# Patient Record
Sex: Male | Born: 1965 | Race: White | Hispanic: No | Marital: Single | Smoking: Current every day smoker
Health system: Southern US, Community
[De-identification: ages and names within clinical notes are randomized; demographics above are authoritative.]

## PROBLEM LIST (undated history)

## (undated) DIAGNOSIS — F909 Attention-deficit hyperactivity disorder, unspecified type: Secondary | ICD-10-CM

## (undated) DIAGNOSIS — I10 Essential (primary) hypertension: Secondary | ICD-10-CM

## (undated) HISTORY — PX: LAMINECTOMY: SHX219

---

## 1999-10-27 ENCOUNTER — Emergency Department (HOSPITAL_COMMUNITY): Admission: EM | Admit: 1999-10-27 | Discharge: 1999-10-27 | Payer: Self-pay | Admitting: Emergency Medicine

## 1999-11-06 ENCOUNTER — Emergency Department (HOSPITAL_COMMUNITY): Admission: EM | Admit: 1999-11-06 | Discharge: 1999-11-06 | Payer: Self-pay | Admitting: Emergency Medicine

## 2001-12-25 ENCOUNTER — Inpatient Hospital Stay (HOSPITAL_COMMUNITY): Admission: EM | Admit: 2001-12-25 | Discharge: 2001-12-29 | Payer: Self-pay | Admitting: Psychiatry

## 2002-10-19 ENCOUNTER — Emergency Department (HOSPITAL_COMMUNITY): Admission: EM | Admit: 2002-10-19 | Discharge: 2002-10-19 | Payer: Self-pay | Admitting: Emergency Medicine

## 2002-10-19 ENCOUNTER — Encounter: Payer: Self-pay | Admitting: Emergency Medicine

## 2004-04-20 ENCOUNTER — Encounter: Admission: RE | Admit: 2004-04-20 | Discharge: 2004-04-20 | Payer: Self-pay | Admitting: Family Medicine

## 2004-05-16 ENCOUNTER — Encounter: Admission: RE | Admit: 2004-05-16 | Discharge: 2004-05-16 | Payer: Self-pay | Admitting: Orthopaedic Surgery

## 2004-06-02 ENCOUNTER — Encounter: Admission: RE | Admit: 2004-06-02 | Discharge: 2004-06-02 | Payer: Self-pay | Admitting: Surgery

## 2004-07-11 ENCOUNTER — Encounter: Admission: RE | Admit: 2004-07-11 | Discharge: 2004-07-11 | Payer: Self-pay | Admitting: Orthopaedic Surgery

## 2004-09-06 ENCOUNTER — Inpatient Hospital Stay (HOSPITAL_COMMUNITY): Admission: RE | Admit: 2004-09-06 | Discharge: 2004-09-10 | Payer: Self-pay | Admitting: Orthopaedic Surgery

## 2005-02-22 ENCOUNTER — Emergency Department (HOSPITAL_COMMUNITY): Admission: EM | Admit: 2005-02-22 | Discharge: 2005-02-22 | Payer: Self-pay | Admitting: Emergency Medicine

## 2005-02-25 ENCOUNTER — Inpatient Hospital Stay (HOSPITAL_COMMUNITY): Admission: RE | Admit: 2005-02-25 | Discharge: 2005-02-28 | Payer: Self-pay | Admitting: Psychiatry

## 2005-02-25 ENCOUNTER — Ambulatory Visit: Payer: Self-pay | Admitting: Psychiatry

## 2006-08-21 IMAGING — CR DG LUMBAR SPINE COMPLETE 4+V
5 series · 5 of 5 positions shown · non-contrast
Comparison: none

CLINICAL DATA: Low back pain and spasm, progressive over four days.
 DIAGNOSTIC LUMBAR SPINE, COMPLETE ? 04/20/04

[view not recorded (1 of 5)]
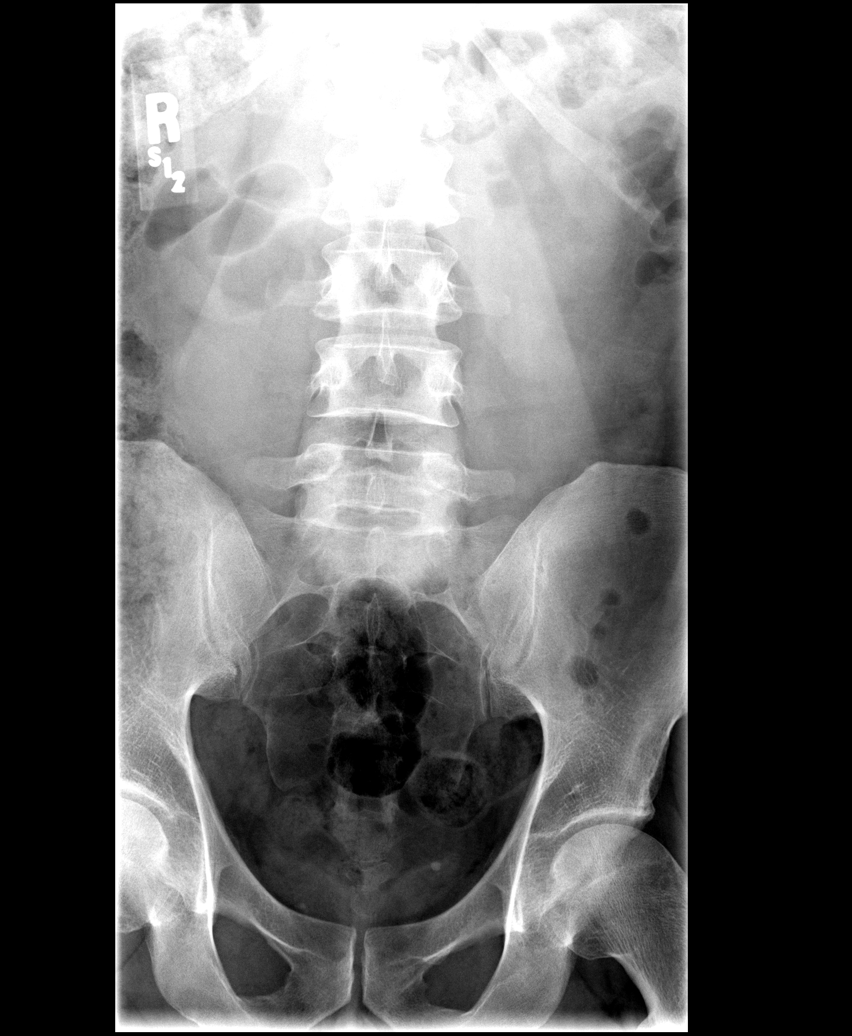

[view not recorded (2 of 5)]
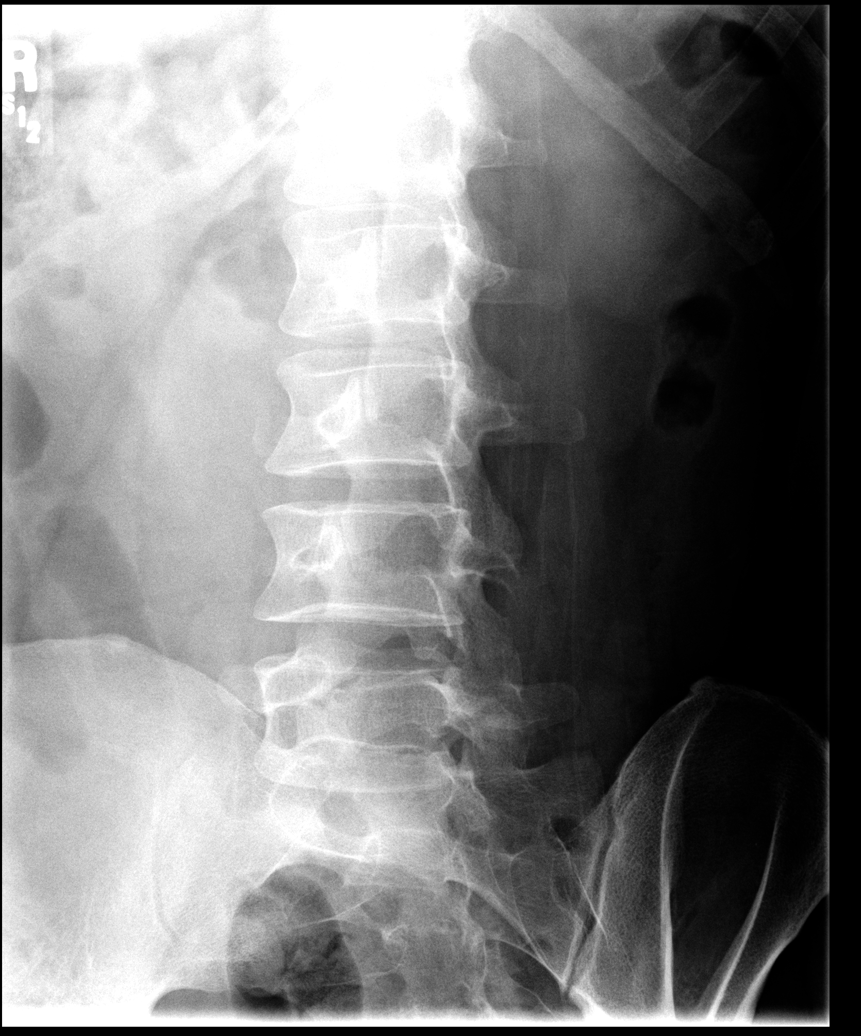

[view not recorded (3 of 5)]
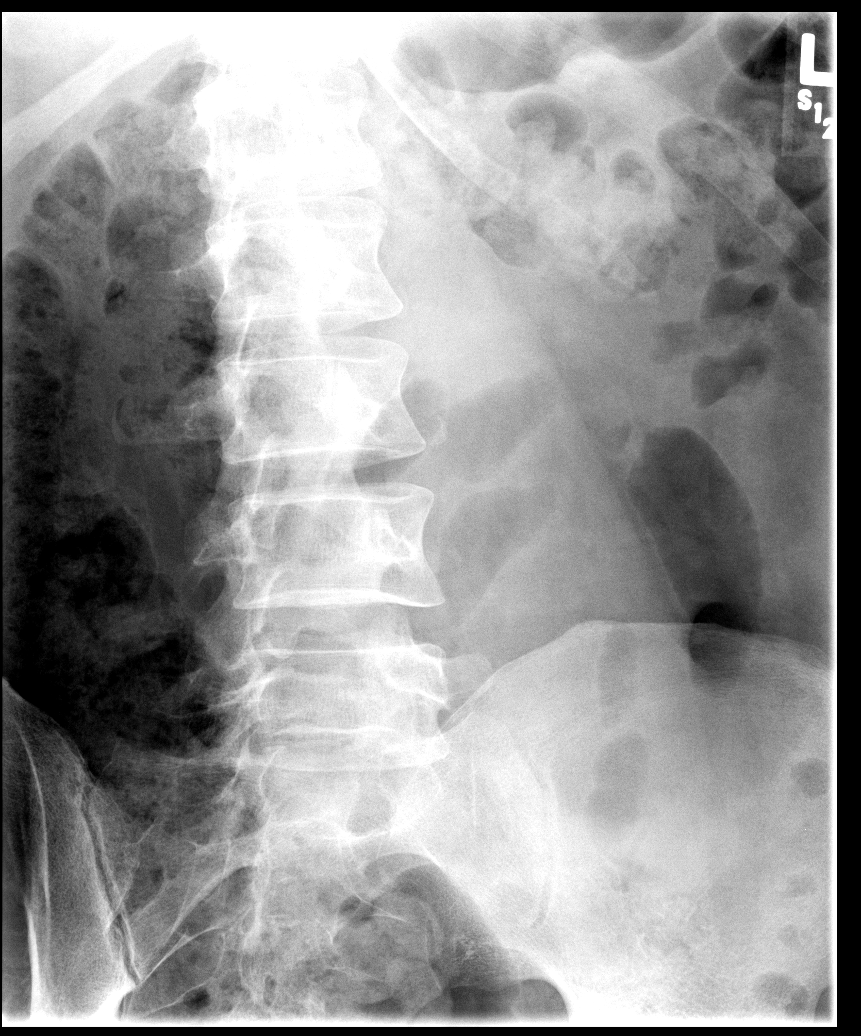

[view not recorded (4 of 5)]
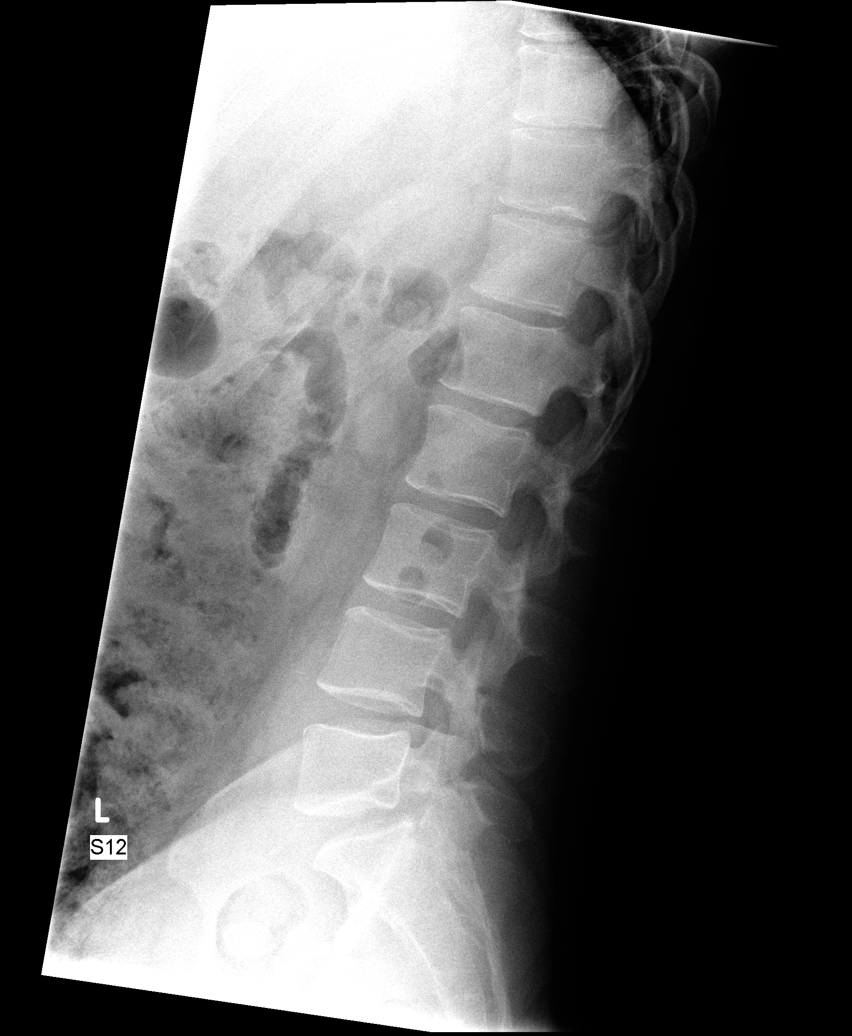

[view not recorded (5 of 5)]
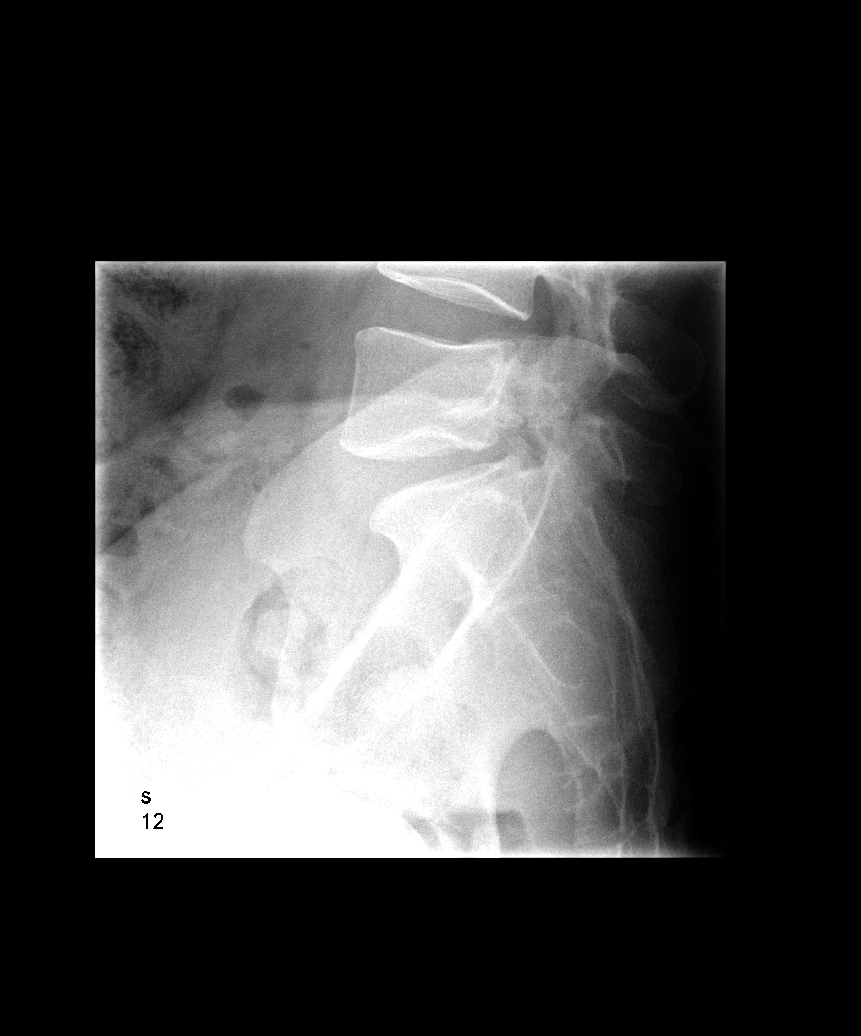

[5 of 5 positions shown; findings below may reference images not displayed]

FINDINGS: Five non rib bearing lumbar vertebrae are seen with right L5 and probable L5 spondylolysis.  Slight 6 mm anterolisthesis is seen at L5-S1 with slight degenerative disk space narrowing.  Remaining lumbar disk spaces and posterior vertebral alignment appear normally maintained.
 IMPRESSION
 1.  Probable bilateral L5 spondylolysis with 6 mm grade anterolisthesis, L5-S1.
 2.  Mild degenerative disk space narrowing, L5-S1.
 3.  Otherwise negative.

## 2015-01-13 ENCOUNTER — Emergency Department (HOSPITAL_COMMUNITY): Payer: Self-pay

## 2015-01-13 ENCOUNTER — Encounter (HOSPITAL_COMMUNITY): Admission: EM | Disposition: A | Discharge status: Home / Self Care | Payer: Self-pay | Source: Home / Self Care

## 2015-01-13 ENCOUNTER — Encounter (HOSPITAL_COMMUNITY): Payer: Self-pay | Admitting: Emergency Medicine

## 2015-01-13 ENCOUNTER — Inpatient Hospital Stay (HOSPITAL_COMMUNITY)
Admission: EM | Admit: 2015-01-13 | Discharge: 2015-01-16 | DRG: 339 | Disposition: A | Discharge status: Home / Self Care | Payer: Self-pay | Attending: Surgery | Admitting: Surgery

## 2015-01-13 ENCOUNTER — Inpatient Hospital Stay (HOSPITAL_COMMUNITY): Payer: Self-pay | Admitting: Registered Nurse

## 2015-01-13 ENCOUNTER — Other Ambulatory Visit (HOSPITAL_COMMUNITY): Payer: Self-pay

## 2015-01-13 DIAGNOSIS — Z79899 Other long term (current) drug therapy: Secondary | ICD-10-CM

## 2015-01-13 DIAGNOSIS — K567 Ileus, unspecified: Secondary | ICD-10-CM | POA: Diagnosis not present

## 2015-01-13 DIAGNOSIS — Z7982 Long term (current) use of aspirin: Secondary | ICD-10-CM

## 2015-01-13 DIAGNOSIS — I10 Essential (primary) hypertension: Secondary | ICD-10-CM | POA: Diagnosis present

## 2015-01-13 DIAGNOSIS — K353 Acute appendicitis with localized peritonitis: Principal | ICD-10-CM | POA: Diagnosis present

## 2015-01-13 DIAGNOSIS — F17210 Nicotine dependence, cigarettes, uncomplicated: Secondary | ICD-10-CM | POA: Diagnosis present

## 2015-01-13 DIAGNOSIS — F329 Major depressive disorder, single episode, unspecified: Secondary | ICD-10-CM | POA: Diagnosis present

## 2015-01-13 DIAGNOSIS — F909 Attention-deficit hyperactivity disorder, unspecified type: Secondary | ICD-10-CM | POA: Diagnosis present

## 2015-01-13 DIAGNOSIS — R52 Pain, unspecified: Secondary | ICD-10-CM

## 2015-01-13 DIAGNOSIS — K3533 Acute appendicitis with perforation and localized peritonitis, with abscess: Secondary | ICD-10-CM | POA: Diagnosis present

## 2015-01-13 DIAGNOSIS — Z833 Family history of diabetes mellitus: Secondary | ICD-10-CM

## 2015-01-13 HISTORY — DX: Attention-deficit hyperactivity disorder, unspecified type: F90.9

## 2015-01-13 HISTORY — PX: LAPAROSCOPIC APPENDECTOMY: SHX408

## 2015-01-13 HISTORY — DX: Essential (primary) hypertension: I10

## 2015-01-13 LAB — URINALYSIS, ROUTINE W REFLEX MICROSCOPIC
Bilirubin Urine: NEGATIVE
Glucose, UA: NEGATIVE mg/dL
Hgb urine dipstick: NEGATIVE
Ketones, ur: NEGATIVE mg/dL
Leukocytes, UA: NEGATIVE
Nitrite: NEGATIVE
Protein, ur: NEGATIVE mg/dL
Specific Gravity, Urine: 1.006 (ref 1.005–1.030)
Urobilinogen, UA: 0.2 mg/dL (ref 0.0–1.0)
pH: 7 (ref 5.0–8.0)

## 2015-01-13 LAB — CBC WITH DIFFERENTIAL/PLATELET
Basophils Absolute: 0 10*3/uL (ref 0.0–0.1)
Basophils Relative: 0 % (ref 0–1)
Eosinophils Absolute: 0.1 10*3/uL (ref 0.0–0.7)
Eosinophils Relative: 1 % (ref 0–5)
HCT: 43.9 % (ref 39.0–52.0)
Hemoglobin: 15.1 g/dL (ref 13.0–17.0)
Lymphocytes Relative: 12 % (ref 12–46)
Lymphs Abs: 2.1 10*3/uL (ref 0.7–4.0)
MCH: 35.4 pg — ABNORMAL HIGH (ref 26.0–34.0)
MCHC: 34.4 g/dL (ref 30.0–36.0)
MCV: 102.8 fL — ABNORMAL HIGH (ref 78.0–100.0)
Monocytes Absolute: 1.2 10*3/uL — ABNORMAL HIGH (ref 0.1–1.0)
Monocytes Relative: 7 % (ref 3–12)
Neutro Abs: 13.7 10*3/uL — ABNORMAL HIGH (ref 1.7–7.7)
Neutrophils Relative %: 80 % — ABNORMAL HIGH (ref 43–77)
Platelets: 194 10*3/uL (ref 150–400)
RBC: 4.27 MIL/uL (ref 4.22–5.81)
RDW: 12.7 % (ref 11.5–15.5)
WBC: 17.1 10*3/uL — ABNORMAL HIGH (ref 4.0–10.5)

## 2015-01-13 LAB — COMPREHENSIVE METABOLIC PANEL
ALT: 11 U/L — ABNORMAL LOW (ref 17–63)
AST: 20 U/L (ref 15–41)
Albumin: 4.6 g/dL (ref 3.5–5.0)
Alkaline Phosphatase: 108 U/L (ref 38–126)
Anion gap: 11 (ref 5–15)
BUN: 15 mg/dL (ref 6–20)
CO2: 25 mmol/L (ref 22–32)
Calcium: 9.6 mg/dL (ref 8.9–10.3)
Chloride: 100 mmol/L — ABNORMAL LOW (ref 101–111)
Creatinine, Ser: 1.08 mg/dL (ref 0.61–1.24)
GFR calc Af Amer: >60 mL/min (ref >60)
GFR calc non Af Amer: >60 mL/min (ref >60)
Glucose, Bld: 91 mg/dL (ref 65–99)
Potassium: 4.2 mmol/L (ref 3.5–5.1)
Sodium: 136 mmol/L (ref 135–145)
Total Bilirubin: 0.7 mg/dL (ref 0.3–1.2)
Total Protein: 8.6 g/dL — ABNORMAL HIGH (ref 6.5–8.1)

## 2015-01-13 LAB — ETHANOL: Alcohol, Ethyl (B): <5 mg/dL (ref <5)

## 2015-01-13 LAB — LIPASE, BLOOD: Lipase: 17 U/L — ABNORMAL LOW (ref 22–51)

## 2015-01-13 SURGERY — APPENDECTOMY, LAPAROSCOPIC
Anesthesia: General | Site: Abdomen

## 2015-01-13 MED ORDER — PIPERACILLIN-TAZOBACTAM 3.375 G IVPB 30 MIN: 3.3750 g | Freq: Once | INTRAVENOUS | Status: DC

## 2015-01-13 MED ORDER — PIPERACILLIN-TAZOBACTAM 3.375 G IVPB
3.3750 g | Freq: Three times a day (TID) | INTRAVENOUS | Status: DC
  Administered 2015-01-13 – 2015-01-16 (×9): 3.375 g via INTRAVENOUS
  Filled 2015-01-13 (×10): qty 50

## 2015-01-13 MED ORDER — LACTATED RINGERS IV SOLN
INTRAVENOUS | Status: DC
  Administered 2015-01-13: 1000 mL via INTRAVENOUS

## 2015-01-13 MED ORDER — KCL IN DEXTROSE-NACL 30-5-0.45 MEQ/L-%-% IV SOLN
INTRAVENOUS | Status: DC
  Administered 2015-01-13 – 2015-01-16 (×4): via INTRAVENOUS
  Filled 2015-01-13 (×5): qty 1000

## 2015-01-13 MED ORDER — LIDOCAINE HCL (CARDIAC) 20 MG/ML IV SOLN
INTRAVENOUS | Status: AC
  Filled 2015-01-13: qty 5

## 2015-01-13 MED ORDER — NEOSTIGMINE METHYLSULFATE 10 MG/10ML IV SOLN
INTRAVENOUS | Status: DC | PRN
  Administered 2015-01-13: 4 mg via INTRAVENOUS

## 2015-01-13 MED ORDER — PROPOFOL 10 MG/ML IV BOLUS
INTRAVENOUS | Status: DC | PRN
  Administered 2015-01-13: 160 mg via INTRAVENOUS

## 2015-01-13 MED ORDER — GLYCOPYRROLATE 0.2 MG/ML IJ SOLN
INTRAMUSCULAR | Status: DC | PRN
  Administered 2015-01-13: .4 mg via INTRAVENOUS

## 2015-01-13 MED ORDER — ONDANSETRON HCL 4 MG/2ML IJ SOLN: 4.0000 mg | Freq: Four times a day (QID) | INTRAMUSCULAR | Status: DC | PRN

## 2015-01-13 MED ORDER — BUPIVACAINE-EPINEPHRINE (PF) 0.25% -1:200000 IJ SOLN
INTRAMUSCULAR | Status: AC
  Filled 2015-01-13: qty 30

## 2015-01-13 MED ORDER — IBUPROFEN 200 MG PO TABS: 600.0000 mg | ORAL_TABLET | Freq: Four times a day (QID) | ORAL | Status: DC | PRN

## 2015-01-13 MED ORDER — IOHEXOL 300 MG/ML  SOLN
50.0000 mL | Freq: Once | INTRAMUSCULAR | Status: AC | PRN
  Administered 2015-01-13: 50 mL via ORAL

## 2015-01-13 MED ORDER — SUCCINYLCHOLINE CHLORIDE 20 MG/ML IJ SOLN
INTRAMUSCULAR | Status: DC | PRN
  Administered 2015-01-13: 100 mg via INTRAVENOUS

## 2015-01-13 MED ORDER — PNEUMOCOCCAL VAC POLYVALENT 25 MCG/0.5ML IJ INJ: 0.5000 mL | INJECTION | INTRAMUSCULAR | Status: DC

## 2015-01-13 MED ORDER — ONDANSETRON HCL 4 MG/2ML IJ SOLN
INTRAMUSCULAR | Status: DC | PRN
  Administered 2015-01-13: 4 mg via INTRAVENOUS

## 2015-01-13 MED ORDER — IOHEXOL 300 MG/ML  SOLN
100.0000 mL | Freq: Once | INTRAMUSCULAR | Status: AC | PRN
  Administered 2015-01-13: 100 mL via INTRAVENOUS

## 2015-01-13 MED ORDER — SODIUM CHLORIDE 0.9 % IJ SOLN
INTRAMUSCULAR | Status: AC
  Filled 2015-01-13: qty 10

## 2015-01-13 MED ORDER — KETOROLAC TROMETHAMINE 30 MG/ML IJ SOLN
INTRAMUSCULAR | Status: AC
  Filled 2015-01-13: qty 1

## 2015-01-13 MED ORDER — ONDANSETRON HCL 4 MG/2ML IJ SOLN
4.0000 mg | Freq: Once | INTRAMUSCULAR | Status: AC
  Administered 2015-01-13: 4 mg via INTRAVENOUS
  Filled 2015-01-13: qty 2

## 2015-01-13 MED ORDER — FENTANYL CITRATE (PF) 100 MCG/2ML IJ SOLN
INTRAMUSCULAR | Status: AC
  Filled 2015-01-13: qty 2

## 2015-01-13 MED ORDER — PROPOFOL 10 MG/ML IV BOLUS
INTRAVENOUS | Status: AC
  Filled 2015-01-13: qty 20

## 2015-01-13 MED ORDER — SODIUM CHLORIDE 0.9 % IV SOLN: INTRAVENOUS | Status: DC

## 2015-01-13 MED ORDER — PIPERACILLIN-TAZOBACTAM 3.375 G IVPB: 3.3750 g | Freq: Three times a day (TID) | INTRAVENOUS | Status: DC

## 2015-01-13 MED ORDER — MIDAZOLAM HCL 2 MG/2ML IJ SOLN
INTRAMUSCULAR | Status: AC
  Filled 2015-01-13: qty 2

## 2015-01-13 MED ORDER — PHENYLEPHRINE 40 MCG/ML (10ML) SYRINGE FOR IV PUSH (FOR BLOOD PRESSURE SUPPORT)
PREFILLED_SYRINGE | INTRAVENOUS | Status: AC
  Filled 2015-01-13: qty 10

## 2015-01-13 MED ORDER — SODIUM CHLORIDE 0.9 % IV BOLUS (SEPSIS): 1000.0000 mL | Freq: Once | INTRAVENOUS | Status: DC

## 2015-01-13 MED ORDER — EPHEDRINE SULFATE 50 MG/ML IJ SOLN
INTRAMUSCULAR | Status: AC
  Filled 2015-01-13: qty 1

## 2015-01-13 MED ORDER — FENTANYL CITRATE (PF) 100 MCG/2ML IJ SOLN
25.0000 ug | INTRAMUSCULAR | Status: DC | PRN
  Administered 2015-01-13 (×3): 50 ug via INTRAVENOUS

## 2015-01-13 MED ORDER — PROMETHAZINE HCL 25 MG/ML IJ SOLN: 6.2500 mg | INTRAMUSCULAR | Status: DC | PRN

## 2015-01-13 MED ORDER — HYDROCODONE-ACETAMINOPHEN 5-325 MG PO TABS
1.0000 | ORAL_TABLET | ORAL | Status: DC | PRN
  Administered 2015-01-14 (×4): 2 via ORAL
  Filled 2015-01-13 (×5): qty 2

## 2015-01-13 MED ORDER — DULOXETINE HCL 30 MG PO CPEP
30.0000 mg | ORAL_CAPSULE | Freq: Every day | ORAL | Status: DC
  Administered 2015-01-14 – 2015-01-16 (×3): 30 mg via ORAL
  Filled 2015-01-13 (×3): qty 1

## 2015-01-13 MED ORDER — KETOROLAC TROMETHAMINE 30 MG/ML IJ SOLN
30.0000 mg | Freq: Once | INTRAMUSCULAR | Status: AC
  Administered 2015-01-13: 30 mg via INTRAVENOUS

## 2015-01-13 MED ORDER — GLYCOPYRROLATE 0.2 MG/ML IJ SOLN
INTRAMUSCULAR | Status: AC
  Filled 2015-01-13: qty 3

## 2015-01-13 MED ORDER — PIPERACILLIN-TAZOBACTAM 3.375 G IVPB 30 MIN
3.3750 g | Freq: Once | INTRAVENOUS | Status: AC
  Administered 2015-01-13: 3.375 g via INTRAVENOUS
  Filled 2015-01-13: qty 50

## 2015-01-13 MED ORDER — SODIUM CHLORIDE 0.9 % IV BOLUS (SEPSIS)
1000.0000 mL | Freq: Once | INTRAVENOUS | Status: AC
  Administered 2015-01-13: 1000 mL via INTRAVENOUS

## 2015-01-13 MED ORDER — FENTANYL CITRATE (PF) 250 MCG/5ML IJ SOLN
INTRAMUSCULAR | Status: AC
  Filled 2015-01-13: qty 5

## 2015-01-13 MED ORDER — MORPHINE SULFATE 2 MG/ML IJ SOLN: 1.0000 mg | INTRAMUSCULAR | Status: DC | PRN

## 2015-01-13 MED ORDER — ONDANSETRON HCL 4 MG/2ML IJ SOLN
INTRAMUSCULAR | Status: AC
  Filled 2015-01-13: qty 2

## 2015-01-13 MED ORDER — HYDROMORPHONE HCL 1 MG/ML IJ SOLN
1.0000 mg | INTRAMUSCULAR | Status: DC | PRN
  Administered 2015-01-13 (×2): 1 mg via INTRAVENOUS
  Administered 2015-01-15 (×2): 2 mg via INTRAVENOUS
  Filled 2015-01-13: qty 1
  Filled 2015-01-13 (×2): qty 2
  Filled 2015-01-13 (×2): qty 1

## 2015-01-13 MED ORDER — ROCURONIUM BROMIDE 100 MG/10ML IV SOLN
INTRAVENOUS | Status: AC
  Filled 2015-01-13: qty 1

## 2015-01-13 MED ORDER — ROCURONIUM BROMIDE 100 MG/10ML IV SOLN
INTRAVENOUS | Status: DC | PRN
  Administered 2015-01-13: 30 mg via INTRAVENOUS

## 2015-01-13 MED ORDER — FENTANYL CITRATE (PF) 100 MCG/2ML IJ SOLN
INTRAMUSCULAR | Status: DC | PRN
  Administered 2015-01-13 (×3): 50 ug via INTRAVENOUS

## 2015-01-13 MED ORDER — AMPHETAMINE-DEXTROAMPHETAMINE 10 MG PO TABS
20.0000 mg | ORAL_TABLET | ORAL | Status: DC
  Administered 2015-01-14 – 2015-01-16 (×6): 20 mg via ORAL
  Filled 2015-01-13 (×6): qty 2

## 2015-01-13 MED ORDER — MIDAZOLAM HCL 5 MG/5ML IJ SOLN
INTRAMUSCULAR | Status: DC | PRN
  Administered 2015-01-13 (×2): 1 mg via INTRAVENOUS

## 2015-01-13 MED ORDER — ONDANSETRON HCL 4 MG/2ML IJ SOLN
4.0000 mg | Freq: Four times a day (QID) | INTRAMUSCULAR | Status: DC | PRN
  Administered 2015-01-16: 4 mg via INTRAVENOUS
  Filled 2015-01-13: qty 2

## 2015-01-13 MED ORDER — MORPHINE SULFATE 4 MG/ML IJ SOLN
4.0000 mg | Freq: Once | INTRAMUSCULAR | Status: AC
  Administered 2015-01-13: 4 mg via INTRAVENOUS
  Filled 2015-01-13: qty 1

## 2015-01-13 MED ORDER — LACTATED RINGERS IV SOLN
INTRAVENOUS | Status: DC | PRN
  Administered 2015-01-13: 1000 mL

## 2015-01-13 MED ORDER — PHENYLEPHRINE HCL 10 MG/ML IJ SOLN
INTRAMUSCULAR | Status: DC | PRN
  Administered 2015-01-13 (×2): 80 ug via INTRAVENOUS

## 2015-01-13 MED ORDER — BUPIVACAINE-EPINEPHRINE 0.25% -1:200000 IJ SOLN
INTRAMUSCULAR | Status: DC | PRN
  Administered 2015-01-13: 20 mL

## 2015-01-13 MED ORDER — LACTATED RINGERS IV SOLN
INTRAVENOUS | Status: DC | PRN
  Administered 2015-01-13: 16:00:00 via INTRAVENOUS

## 2015-01-13 MED ORDER — ATENOLOL 50 MG PO TABS
50.0000 mg | ORAL_TABLET | Freq: Every day | ORAL | Status: DC
Start: 2015-01-14 — End: 2015-01-16
  Administered 2015-01-14 – 2015-01-16 (×3): 50 mg via ORAL
  Filled 2015-01-13 (×3): qty 1

## 2015-01-13 MED ORDER — ONDANSETRON HCL 4 MG PO TABS: 4.0000 mg | ORAL_TABLET | Freq: Four times a day (QID) | ORAL | Status: DC | PRN

## 2015-01-13 MED ORDER — LIDOCAINE HCL (CARDIAC) 20 MG/ML IV SOLN
INTRAVENOUS | Status: DC | PRN
  Administered 2015-01-13: 80 mg via INTRAVENOUS

## 2015-01-13 MED ORDER — MEPERIDINE HCL 50 MG/ML IJ SOLN: 6.2500 mg | INTRAMUSCULAR | Status: DC | PRN

## 2015-01-13 SURGICAL SUPPLY — 43 items
APL SKNCLS STERI-STRIP NONHPOA (GAUZE/BANDAGES/DRESSINGS) ×1
APPLIER CLIP ROT 10 11.4 M/L (STAPLE) ×3
APR CLP MED LRG 11.4X10 (STAPLE) ×1
BAG SPEC RTRVL LRG 6X4 10 (ENDOMECHANICALS) ×1
BENZOIN TINCTURE PRP APPL 2/3 (GAUZE/BANDAGES/DRESSINGS) ×3 IMPLANT
CLIP APPLIE ROT 10 11.4 M/L (STAPLE) IMPLANT
CLOSURE WOUND 1/2 X4 (GAUZE/BANDAGES/DRESSINGS) ×1
CUTTER FLEX LINEAR 45M (STAPLE) ×2 IMPLANT
DECANTER SPIKE VIAL GLASS SM (MISCELLANEOUS) ×1 IMPLANT
DRAPE LAPAROSCOPIC ABDOMINAL (DRAPES) ×3 IMPLANT
ELECT REM PT RETURN 9FT ADLT (ELECTROSURGICAL) ×3
ELECTRODE REM PT RTRN 9FT ADLT (ELECTROSURGICAL) ×1 IMPLANT
ENDOLOOP SUT PDS II  0 18 (SUTURE)
ENDOLOOP SUT PDS II 0 18 (SUTURE) IMPLANT
GAUZE SPONGE 2X2 8PLY STRL LF (GAUZE/BANDAGES/DRESSINGS) IMPLANT
GLOVE BIOGEL PI IND STRL 7.0 (GLOVE) ×1 IMPLANT
GLOVE BIOGEL PI INDICATOR 7.0 (GLOVE)
GLOVE SURG ORTHO 8.0 STRL STRW (GLOVE) ×3 IMPLANT
GOWN STRL REUS W/TWL LRG LVL3 (GOWN DISPOSABLE) ×1 IMPLANT
GOWN STRL REUS W/TWL XL LVL3 (GOWN DISPOSABLE) ×4 IMPLANT
KIT BASIN OR (CUSTOM PROCEDURE TRAY) ×3 IMPLANT
PENCIL BUTTON HOLSTER BLD 10FT (ELECTRODE) IMPLANT
POUCH SPECIMEN RETRIEVAL 10MM (ENDOMECHANICALS) ×2 IMPLANT
RELOAD 45 VASCULAR/THIN (ENDOMECHANICALS) IMPLANT
RELOAD STAPLE 45 2.5 WHT GRN (ENDOMECHANICALS) IMPLANT
RELOAD STAPLE 45 3.5 BLU ETS (ENDOMECHANICALS) IMPLANT
RELOAD STAPLE TA45 3.5 REG BLU (ENDOMECHANICALS) ×3 IMPLANT
SET IRRIG TUBING LAPAROSCOPIC (IRRIGATION / IRRIGATOR) ×2 IMPLANT
SHEARS HARMONIC ACE PLUS 36CM (ENDOMECHANICALS) ×2 IMPLANT
SOLUTION ANTI FOG 6CC (MISCELLANEOUS) ×1 IMPLANT
SPONGE GAUZE 2X2 STER 10/PKG (GAUZE/BANDAGES/DRESSINGS) ×2
STRIP CLOSURE SKIN 1/2X4 (GAUZE/BANDAGES/DRESSINGS) ×2 IMPLANT
SUT MNCRL AB 4-0 PS2 18 (SUTURE) ×3 IMPLANT
SUT VICRYL 0 UR6 27IN ABS (SUTURE) ×4 IMPLANT
TOWEL OR 17X26 10 PK STRL BLUE (TOWEL DISPOSABLE) ×3 IMPLANT
TRAY CATH 16FR W/PLASTIC CATH (SET/KITS/TRAYS/PACK) ×2 IMPLANT
TRAY FOLEY W/METER SILVER 14FR (SET/KITS/TRAYS/PACK) ×1 IMPLANT
TRAY LAPAROSCOPIC (CUSTOM PROCEDURE TRAY) ×3 IMPLANT
TROCAR BLADELESS OPT 5 75 (ENDOMECHANICALS) ×3 IMPLANT
TROCAR XCEL BLUNT TIP 100MML (ENDOMECHANICALS) ×3 IMPLANT
TROCAR XCEL NON-BLD 11X100MML (ENDOMECHANICALS) ×3 IMPLANT
TUBING INSUFFLATION 10FT LAP (TUBING) ×3 IMPLANT
WATER STERILE IRR 1500ML POUR (IV SOLUTION) ×1 IMPLANT

## 2015-01-13 NOTE — Transfer of Care (Signed)
Immediate Anesthesia Transfer of Care Note  Patient: Sean Flores  Procedure(s) Performed: Procedure(s): APPENDECTOMY LAPAROSCOPIC (N/A)  Patient Location: PACU  Anesthesia Type:General  Level of Consciousness: awake, alert , oriented and patient cooperative  Airway & Oxygen Therapy: Patient Spontanous Breathing and Patient connected to face mask oxygen  Post-op Assessment: Report given to RN, Post -op Vital signs reviewed and stable and Patient moving all extremities  Post vital signs: Reviewed and stable  Last Vitals:  Filed Vitals:   01/13/15 1500  BP: 121/86  Pulse: 117  Temp:   Resp: 18    Complications: No apparent anesthesia complications

## 2015-01-13 NOTE — ED Notes (Signed)
Pt reports lower abdominal pain radiating to right flank, also co nausea and dysuria, denies diarrhea nor hematuria. Pt reports fever and chills. Hx 1 UTI .

## 2015-01-13 NOTE — Progress Notes (Signed)
Pt was by [facility] staff, Kennyth Arnold and discussed with her her would have medication issues CM was notified

## 2015-01-13 NOTE — Anesthesia Postprocedure Evaluation (Signed)
  Anesthesia Post-op Note  Patient: Sean Flores  Procedure(s) Performed: Procedure(s) (LRB): APPENDECTOMY LAPAROSCOPIC (N/A)  Patient Location: PACU  Anesthesia Type: General  Level of Consciousness: awake and alert   Airway and Oxygen Therapy: Patient Spontanous Breathing  Post-op Pain: mild  Post-op Assessment: Post-op Vital signs reviewed, Patient's Cardiovascular Status Stable, Respiratory Function Stable, Patent Airway and No signs of Nausea or vomiting  Last Vitals:  Filed Vitals:   01/13/15 1800  BP: 112/73  Pulse: 108  Temp: 37.2 C  Resp: 23    Post-op Vital Signs: stable   Complications: No apparent anesthesia complications

## 2015-01-13 NOTE — ED Provider Notes (Signed)
CSN: [id number]     Arrival date & time 01/13/15  0940 History   First MD Initiated Contact with Patient 01/13/15 [id number]     No chief complaint on file.    (Consider location/radiation/quality/duration/timing/severity/associated sxs/prior Treatment) HPI Sean Flores is a 49 y.o. male who comes in for evaluation of lower abdominal pain. Patient states his abdominal discomfort began yesterday at 10 AM. He characterizes the sensation as feeling like "a belly full of gravel". He reports the discomfort started out around his bellybutton but is slightly more towards his right lower quadrant at this time. He thought he was constipated, but had a bowel movement Tuesday evening that was normal for him. No bloody or dark stools. Yesterday at 12:00 he believes he began to have a fever as he was alternating between chills and profuse sweating. He also reports associated nausea without vomiting. No other abdominal surgeries. Lying in the fetal position improves his symptoms and walking/moving exacerbate his symptoms. Discomfort is rated as a 7/10. Denies dysuria, hematuria, back pain, diarrhea, chest pain, headache, shortness of breath, numbness or weakness. No new medications. No other aggravating or modifying factors.    Past Medical History  Diagnosis Date  . Hypertension    Past Surgical History  Procedure Laterality Date  . Laminectomy     History reviewed. No pertinent family history. History  Substance Use Topics  . Smoking status: Current Every Day Smoker -- 1.00 packs/day for 20 years  . Smokeless tobacco: Never Used  . Alcohol Use: Yes     Comment: 1 glass wine after dinner approx. 3 to4 times  per week    Review of Systems A 10 point review of systems was completed and was negative except for pertinent positives and negatives as mentioned in the history of present illness   Allergies  Review of patient's allergies indicates no known allergies.  Home Medications   Prior to  Admission medications   Medication Sig Start Date End Date Taking? Authorizing Provider  amphetamine-dextroamphetamine (ADDERALL) 20 MG tablet Take 20 mg by mouth 2 (two) times daily.   Yes Historical Provider, MD  aspirin EC 81 MG tablet Take 162 mg by mouth daily.   Yes Historical Provider, MD  atenolol (TENORMIN) 50 MG tablet Take 50 mg by mouth daily.   Yes Historical Provider, MD  diphenhydrAMINE (BENADRYL) 25 MG tablet Take 50 mg by mouth at bedtime as needed for itching or allergies.   Yes Historical Provider, MD  DULoxetine (CYMBALTA) 30 MG capsule Take 30 mg by mouth daily.   Yes Historical Provider, MD  magnesium hydroxide (MILK OF MAGNESIA) 400 MG/5ML suspension Take 30 mLs by mouth daily as needed for mild constipation.   Yes Historical Provider, MD  senna (SENOKOT) 8.6 MG TABS tablet Take 2 tablets by mouth daily as needed for mild constipation.   Yes Historical Provider, MD   BP 121/86 mmHg  Pulse 117  Temp(Src) 98.1 F (36.7 C)  Resp 18  SpO2 97% Physical Exam  Constitutional: He is oriented to person, place, and time. He appears well-developed and well-nourished.  Patient appears uncomfortable  HENT:  Head: Normocephalic and atraumatic.  Mouth/Throat: Oropharynx is clear and moist.  Eyes: Conjunctivae are normal. Pupils are equal, round, and reactive to light. Right eye exhibits no discharge. Left eye exhibits no discharge. No scleral icterus.  Neck: Neck supple.  Cardiovascular: Normal rate, regular rhythm and normal heart sounds.   Pulmonary/Chest: Effort normal and breath sounds normal. No respiratory distress.  He has no wheezes. He has no rales.  Abdominal: Soft.  Patient with right lower quadrant tenderness to palpation. Abdomen is otherwise soft, nondistended. Negative Murphy's. Positive heel tap. No other lesions or deformities, palpable masses.  Musculoskeletal: He exhibits no tenderness.  Neurological: He is alert and oriented to person, place, and time.   Cranial Nerves II-XII grossly intact  Skin: Skin is warm and dry. No rash noted.  Psychiatric: He has a normal mood and affect.  Nursing note and vitals reviewed.   ED Course  Procedures (including critical care time) Labs Review Labs Reviewed  CBC WITH DIFFERENTIAL/PLATELET - Abnormal; Notable for the following:    WBC 17.1 (*)    MCV 102.8 (*)    MCH 35.4 (*)    Neutrophils Relative % 80 (*)    Neutro Abs 13.7 (*)    Monocytes Absolute 1.2 (*)    All other components within normal limits  COMPREHENSIVE METABOLIC PANEL - Abnormal; Notable for the following:    Chloride 100 (*)    Total Protein 8.6 (*)    ALT 11 (*)    All other components within normal limits  LIPASE, BLOOD - Abnormal; Notable for the following:    Lipase 17 (*)    All other components within normal limits  URINALYSIS, ROUTINE W REFLEX MICROSCOPIC (NOT AT [facility]) - Abnormal; Notable for the following:    APPearance CLOUDY (*)    All other components within normal limits  ETHANOL    Imaging Review Ct Abdomen Pelvis W Contrast  01/13/2015   CLINICAL DATA:  RIGHT-sided abdominal pain radiating to the RIGHT flank. Initial encounter. Nausea and dysuria.  EXAM: CT ABDOMEN AND PELVIS WITH CONTRAST  TECHNIQUE: Multidetector CT imaging of the abdomen and pelvis was performed using the standard protocol following bolus administration of intravenous contrast.  CONTRAST:  OMNIPAQUE IOHEXOL 300 MG/ML SOLN, 50mL OMNIPAQUE IOHEXOL 300 MG/ML SOLN  COMPARISON:  None.  FINDINGS: Musculoskeletal: L5-S1 PLIF with solid fusion. Grade I retrolisthesis of L4 on L5. Incidental synovial herniation cyst in the anterior LEFT femoral neck.  Lung Bases: Dependent atelectasis.  Liver:  Normal.  Spleen:  Normal.  Gallbladder:  Normal.  Common bile duct:  Normal.  Pancreas:  Normal.  Adrenal glands:  Normal bilaterally.  Kidneys: Normal renal enhancement and delayed excretion of contrast. Both ureters appear normal.  Stomach:  Normal.  Small  bowel: No small bowel obstruction. Small bowel contrast has reached the jejunum. There is no contrast in the terminal ileum which is decompressed. Inflammatory changes are present along the terminal ileum associated with acute appendicitis.  Colon: Acute appendicitis present. Feculent material is present along the course of the mid appendix (image 63 series 2) with dilation of the appendix. The appendix measures up to 14 mm transversely. There is probably contained perforation given the size of the appendix. This is immediately adjacent to the RIGHT external iliac vessels. Distal colon appears normal.  Pelvic Genitourinary:  Normal urinary bladder.  Peritoneum: Tiny amount of fluid and phlegmon is present in the anatomic pelvis. This is secondary to acute appendicitis.  Vascular/lymphatic: No acute vascular abnormality. Reactive small ileo colic lymph nodes.  Body Wall: Normal.  IMPRESSION: Acute appendicitis. Likely contained perforation of the retrocecal appendix. Significantly, no discrete abscess or intra-abdominal free air.   Electronically Signed   By: Andreas Newport M.D.   On: 01/13/2015 14:04     EKG Interpretation   Date/Time:  Thursday January 13 2015 09:58:37 EDT Ventricular  Rate:  133 PR Interval:  143 QRS Duration: 88 QT Interval:  315 QTC Calculation: 468 R Axis:   -11 Text Interpretation:  Sinus tachycardia tachycardia new since previous   Confirmed by YAO  MD, DAVID ([id number]) on 01/13/2015 10:03:22 AM     Meds given in ED:  Medications  0.9 %  sodium chloride infusion (not administered)  morphine 2 MG/ML injection 1-4 mg (not administered)  ondansetron (ZOFRAN) injection 4 mg (not administered)  sodium chloride 0.9 % bolus 1,000 mL (not administered)  piperacillin-tazobactam (ZOSYN) IVPB 3.375 g (not administered)  pneumococcal 23 valent vaccine (PNU-IMMUNE) injection 0.5 mL (not administered)  sodium chloride 0.9 % bolus 1,000 mL (0 mLs Intravenous Stopped 01/13/15 1118)  morphine  4 MG/ML injection 4 mg (4 mg Intravenous Given 01/13/15 1018)  ondansetron (ZOFRAN) injection 4 mg (4 mg Intravenous Given 01/13/15 1018)  iohexol (OMNIPAQUE) 300 MG/ML solution 50 mL (50 mLs Oral Contrast Given 01/13/15 1057)  iohexol (OMNIPAQUE) 300 MG/ML solution 100 mL (100 mLs Intravenous Contrast Given 01/13/15 1140)  morphine 4 MG/ML injection 4 mg (4 mg Intravenous Given 01/13/15 1311)  piperacillin-tazobactam (ZOSYN) IVPB 3.375 g (0 g Intravenous Stopped 01/13/15 1524)  sodium chloride 0.9 % bolus 1,000 mL (0 mLs Intravenous Stopped 01/13/15 1524)    New Prescriptions   No medications on file   Filed Vitals:   01/13/15 1030 01/13/15 1355 01/13/15 1430 01/13/15 1500  BP: 118/93 104/77 113/89 121/86  Pulse: 121 123 122 117  Temp:      Resp: 16 18 14 18   SpO2: 97% 96% 95% 97%   Delay and read of CT abdomen due to clerical error.1:15pm  MDM  Patient presents with clinical picture concerning for acute appendicitis. Vitals stable with persistent tachycardia -afebrile Given 2 L normal saline in the ED Pt resting comfortably in ED. Pain is managed in the ED PE--patient with exquisite tenderness in the right lower quadrant. Labwork-leukocytosis 17.1 Imaging--CT abdomen shows evidence of acute appendicitis with possible contained perforation. No discrete abscess or free air noted  DDX--due to delay in CT abdomen and clinical appearance of patient, we will initiate empiric antibiotic for acute appendicitis. Patient given Zosyn in the ED.  Gen. surgery consult and by my attending, Dr. Madilyn Hook who also saw and evaluated the patient. Patient will be taken to the OR for emergent appendectomy.  Final diagnoses:  Pain        Joycie Peek, PA-C 01/13/15 1615  Tilden Fossa, MD 01/14/15 1154

## 2015-01-13 NOTE — Progress Notes (Addendum)
CM spoke with pt who confirms self pay [facility] resident from [place], [place] x 2 weeks with no pcp.  CM discussed and provided written information for self pay pcps, discussed the importance of pcp vs EDP services for f/u care, www.needymeds.org, www.goodrx.com, discounted pharmacies and other [employer] such as [employer] , [employer], affordable care act,  [place] med assist, financial assistance, self pay dental services, [place] med assist, DSS and  health department  Reviewed resources for [employer] self pay pcps like [facility], family medicine at [facility], [facility], [facility], local urgent care centers, [facility], [facility] family practice, general medical clinics, family services of the [place], [facility] urgent care plus others, medication resources, [facility] out patient pharmacies and housing Pt voiced understanding and appreciation of resources provided.    Pt has been contacted by [facility] clinical liaison, Stacy earlier this day. No questions voiced MATCH  ED CM consulted by EDP, Ben for medication assistance   CM reviewed EPIC notes and chart review information CM spoke with the pt about [facility] MATCH program ($3 co pay for each Rx through [facility] program, does not include refills, 7 day expiration of MATCH letter and choice of pharmacies) Pt agreed to receive assistance from program    Pt is eligible for [facility] MATCH program (unable to find pt listed in PDMI per cardholder name inquiry)   Pending confirmation of d/c medication list

## 2015-01-13 NOTE — Op Note (Addendum)
OPERATIVE REPORT - LAPAROSCOPIC APPENDECTOMY  Preop diagnosis: Acute appendicitis  Postop diagnosis:  Acute appendicitis with perforation  Procedure: Laparoscopic appendectomy  Surgeon:  Velora Heckler, MD, FACS  Anesthesia: General endotracheal  Estimated blood loss: Minimal  Preparation: Chlora-prep  Complications: None  Indications:  49 y/o presents with abdominal pain started yesterday, around 10 AM. He characterizes the sensation as feeling like "a belly full of gravel". He reports the discomfort started out around his bellybutton but is slightly more towards his right lower quadrant at this time. He thought he was constipated, but had a bowel movement Tuesday evening that was normal for him. He reports some fever and chills with profuse sweating yesterday after lunch, along with nausea, no vomiting. In ED 7/10 pain with lying in fetal position makes him more comfortable,and movement/walking makes it worse.  Work up show WBC 17K with left shift. CT scan shows: Acute appendicitis. Likely contained perforation of the retrocecal appendix.   Procedure:  Patient is brought to the operating room and placed in a supine position on the operating room table. Following administration of general anesthesia, a time out was held and the patient's name and procedure is confirmed. Patient is then prepped and draped in the usual strict aseptic fashion.  After ascertaining that an adequate level of anesthesia has been achieved, a peri-umbilical incision is made with a #15 blade. Dissection is carried down to the fascia. Fascia is incised in the midline and the peritoneal cavity is entered cautiously. A #0-vicryl pursestring suture is placed in the fascia. An Hassan cannula is introduced under direct vision and secured with the pursestring suture. The abdomen is insufflated with carbon dioxide. The laparoscope is introduced and the abdomen is explored. Operative ports are placed in the right upper  quadrant and left lower quadrant. The appendix is identified. It was adherent to the iliac vessels and extended into the pelvis.  Upon mobilization, there was obvious perforation with fecal material present in the pelvis.  This was evacuated.  The mesoappendix is divided with the harmonic scalpel. Dissection is carried down to the base of the appendix. The base of the appendix is dissected out clearing the junction with the cecal wall. Using an Endo-GIA stapler, the base of the appendix is transected at the junction with the cecal wall. There is good approximation of tissue along the staple line. There is good hemostasis along the staple line. The appendix is placed into an endo-catch bag and withdrawn through the umbilical port. The fascia was closed with interrupted #0-vicryl sutures.  Right lower quadrant is irrigated with warm saline which is evacuated. Good hemostasis is noted. Ports are removed under direct vision. Good hemostasis is noted at the port sites. Pneumoperitoneum is released.  Skin incisions are anesthetized with local anesthetic. Wounds are closed with interrupted 4-0 Monocryl subcuticular sutures. Wounds are washed and dried and benzoin and Steri-Strips are applied. Dressings are applied. The patient is awakened from anesthesia and brought to the recovery room. The patient tolerated the procedure well.  Velora Heckler, MD, [facility] Office: [phone]

## 2015-01-13 NOTE — Anesthesia Preprocedure Evaluation (Addendum)
Anesthesia Evaluation  Patient identified by MRN, date of birth, ID band Patient awake    Reviewed: Allergy & Precautions, NPO status , Patient's Chart, lab work & pertinent test results  Airway Mallampati: II  TM Distance: >3 FB Neck ROM: Full    Dental no notable dental hx.    Pulmonary neg pulmonary ROS, Current Smoker,  breath sounds clear to auscultation  Pulmonary exam normal       Cardiovascular hypertension, Normal cardiovascular examRhythm:Regular Rate:Normal     Neuro/Psych negative neurological ROS  negative psych ROS   GI/Hepatic negative GI ROS, Neg liver ROS,   Endo/Other  negative endocrine ROS  Renal/GU negative Renal ROS  negative genitourinary   Musculoskeletal negative musculoskeletal ROS (+)   Abdominal   Peds negative pediatric ROS (+)  Hematology negative hematology ROS (+)   Anesthesia Other Findings   Reproductive/Obstetrics negative OB ROS                          Anesthesia Physical Anesthesia Plan  ASA: II and emergent  Anesthesia Plan: General   Post-op Pain Management:    Induction: Intravenous, Rapid sequence and Cricoid pressure planned  Airway Management Planned: Oral ETT  Additional Equipment:   Intra-op Plan:   Post-operative Plan: Extubation in OR  Informed Consent: I have reviewed the patients History and Physical, chart, labs and discussed the procedure including the risks, benefits and alternatives for the proposed anesthesia with the patient or authorized representative who has indicated his/her understanding and acceptance.   Dental advisory given  Plan Discussed with: CRNA  Anesthesia Plan Comments:         Anesthesia Quick Evaluation

## 2015-01-13 NOTE — Anesthesia Procedure Notes (Signed)
Procedure Name: Intubation Date/Time: 01/13/2015 4:10 PM Performed by: Jarvis Newcomer A Pre-anesthesia Checklist: Patient identified, Emergency Drugs available, Suction available, Patient being monitored and Timeout performed Patient Re-evaluated:Patient Re-evaluated prior to inductionOxygen Delivery Method: Circle system utilized Preoxygenation: Pre-oxygenation with 100% oxygen Intubation Type: Rapid sequence, Cricoid Pressure applied and IV induction Laryngoscope Size: Mac and 4 Grade View: Grade I Tube type: Oral (RSI with cricoid pressure by Dr. Acey Lav) Number of attempts: 1 Airway Equipment and Method: Stylet Placement Confirmation: ETT inserted through vocal cords under direct vision,  positive ETCO2 and breath sounds checked- equal and bilateral Secured at: 21 cm Tube secured with: Tape Dental Injury: Teeth and Oropharynx as per pre-operative assessment

## 2015-01-13 NOTE — OR Nursing (Signed)
[id number], in and out cath. 200 ml output Arthor Captain

## 2015-01-13 NOTE — H&P (Signed)
Sean Flores is an 49 y.o. male.   Chief Complaint: ABDOMINAL PAIN  HPI: 49 y/o presents with abdominal pain started yesterday, around 10 AM.  He characterizes the sensation as feeling like "a belly full of gravel". He reports the discomfort started out around his bellybutton but is slightly more towards his right lower quadrant at this time. He thought he was constipated, but had a bowel movement Tuesday evening that was normal for him. He reports some fever and chills with profuse sweating  yesterday after lunch, along with nausea, no  vomiting.  In ED 7/10 pain with lying in fetal position makes him more comfortable,and movement/walking makes it worse.   Work up show WBC 17K with left shift.  CT scan shows:  Acute appendicitis. Likely contained perforation of the retrocecal appendix. Significantly, no discrete abscess or intra-abdominal free air.  He has a low grade temp 99.6, he is tachycardic in the bed, most comfortable in the fetal position, his BP is stable but lower than admit    Past Medical History  Diagnosis Date  Hypertension   Tobacco use   Attention deficit disorder   Depression        Past Surgical History  Procedure Laterality Date  . Laminectomy      No family history on file. Social History:  has no tobacco, alcohol, and drug history on file. Single, currently unemployed teacher Tobacco:  <1PPD 25 year Drugs:  None  ETOH: social  Both parents living mother with AODM Siblings living with some joint issues. Allergies: No Known Allergies  Prior to Admission medications   Medication Sig Start Date End Date Taking? Authorizing Provider  amphetamine-dextroamphetamine (ADDERALL) 20 MG tablet Take 20 mg by mouth 2 (two) times daily.   Yes Historical Provider, MD  aspirin EC 81 MG tablet Take 162 mg by mouth daily.   Yes Historical Provider, MD  atenolol (TENORMIN) 50 MG tablet Take 50 mg by mouth daily.   Yes Historical Provider, MD  diphenhydrAMINE (BENADRYL) 25 MG  tablet Take 50 mg by mouth at bedtime as needed for itching or allergies.   Yes Historical Provider, MD  DULoxetine (CYMBALTA) 30 MG capsule Take 30 mg by mouth daily.   Yes Historical Provider, MD  magnesium hydroxide (MILK OF MAGNESIA) 400 MG/5ML suspension Take 30 mLs by mouth daily as needed for mild constipation.   Yes Historical Provider, MD  senna (SENOKOT) 8.6 MG TABS tablet Take 2 tablets by mouth daily as needed for mild constipation.   Yes Historical Provider, MD  .  Results for orders placed or performed during the hospital encounter of 01/13/15 (from the past 48 hour(s))  Urinalysis, Routine w reflex microscopic (not at [facility])     Status: Abnormal   Collection Time: 01/13/15  9:40 AM  Result Value Ref Range   Color, Urine YELLOW YELLOW   APPearance CLOUDY (A) CLEAR   Specific Gravity, Urine 1.006 1.005 - 1.030   pH 7.0 5.0 - 8.0   Glucose, UA NEGATIVE NEGATIVE mg/dL   Hgb urine dipstick NEGATIVE NEGATIVE   Bilirubin Urine NEGATIVE NEGATIVE   Ketones, ur NEGATIVE NEGATIVE mg/dL   Protein, ur NEGATIVE NEGATIVE mg/dL   Urobilinogen, UA 0.2 0.0 - 1.0 mg/dL   Nitrite NEGATIVE NEGATIVE   Leukocytes, UA NEGATIVE NEGATIVE    Comment: MICROSCOPIC NOT DONE ON URINES WITH NEGATIVE PROTEIN, BLOOD, LEUKOCYTES, NITRITE, OR GLUCOSE <1000 mg/dL.  CBC with Differential     Status: Abnormal   Collection Time: 01/13/15  10:12 AM  Result Value Ref Range   WBC 17.1 (H) 4.0 - 10.5 K/uL   RBC 4.27 4.22 - 5.81 MIL/uL   Hemoglobin 15.1 13.0 - 17.0 g/dL   HCT 43.9 39.0 - 52.0 %   MCV 102.8 (H) 78.0 - 100.0 fL   MCH 35.4 (H) 26.0 - 34.0 pg   MCHC 34.4 30.0 - 36.0 g/dL   RDW 12.7 11.5 - 15.5 %   Platelets 194 150 - 400 K/uL   Neutrophils Relative % 80 (H) 43 - 77 %   Neutro Abs 13.7 (H) 1.7 - 7.7 K/uL   Lymphocytes Relative 12 12 - 46 %   Lymphs Abs 2.1 0.7 - 4.0 K/uL   Monocytes Relative 7 3 - 12 %   Monocytes Absolute 1.2 (H) 0.1 - 1.0 K/uL   Eosinophils Relative 1 0 - 5 %   Eosinophils  Absolute 0.1 0.0 - 0.7 K/uL   Basophils Relative 0 0 - 1 %   Basophils Absolute 0.0 0.0 - 0.1 K/uL  Comprehensive metabolic panel     Status: Abnormal   Collection Time: 01/13/15 10:12 AM  Result Value Ref Range   Sodium 136 135 - 145 mmol/L   Potassium 4.2 3.5 - 5.1 mmol/L   Chloride 100 (L) 101 - 111 mmol/L   CO2 25 22 - 32 mmol/L   Glucose, Bld 91 65 - 99 mg/dL   BUN 15 6 - 20 mg/dL   Creatinine, Ser 1.08 0.61 - 1.24 mg/dL   Calcium 9.6 8.9 - 10.3 mg/dL   Total Protein 8.6 (H) 6.5 - 8.1 g/dL   Albumin 4.6 3.5 - 5.0 g/dL   AST 20 15 - 41 U/L   ALT 11 (L) 17 - 63 U/L   Alkaline Phosphatase 108 38 - 126 U/L   Total Bilirubin 0.7 0.3 - 1.2 mg/dL   GFR calc non Af Amer >60 >60 mL/min   GFR calc Af Amer >60 >60 mL/min    Comment: (NOTE) The eGFR has been calculated using the CKD EPI equation. This calculation has not been validated in all clinical situations. eGFR's persistently <60 mL/min signify possible Chronic Kidney Disease.    Anion gap 11 5 - 15  Lipase, blood     Status: Abnormal   Collection Time: 01/13/15 10:12 AM  Result Value Ref Range   Lipase 17 (L) 22 - 51 U/L  Ethanol     Status: None   Collection Time: 01/13/15 10:13 AM  Result Value Ref Range   Alcohol, Ethyl (B) <5 <5 mg/dL    Comment:        LOWEST DETECTABLE LIMIT FOR SERUM ALCOHOL IS 5 mg/dL FOR MEDICAL PURPOSES ONLY    Ct Abdomen Pelvis W Contrast  01/13/2015   CLINICAL DATA:  RIGHT-sided abdominal pain radiating to the RIGHT flank. Initial encounter. Nausea and dysuria.  EXAM: CT ABDOMEN AND PELVIS WITH CONTRAST  TECHNIQUE: Multidetector CT imaging of the abdomen and pelvis was performed using the standard protocol following bolus administration of intravenous contrast.  CONTRAST:  171m OMNIPAQUE IOHEXOL 300 MG/ML SOLN, 562mOMNIPAQUE IOHEXOL 300 MG/ML SOLN  COMPARISON:  None.  FINDINGS: Musculoskeletal: L5-S1 PLIF with solid fusion. Grade I retrolisthesis of L4 on L5. Incidental synovial herniation  cyst in the anterior LEFT femoral neck.  Lung Bases: Dependent atelectasis.  Liver:  Normal.  Spleen:  Normal.  Gallbladder:  Normal.  Common bile duct:  Normal.  Pancreas:  Normal.  Adrenal glands:  Normal bilaterally.  Kidneys: Normal renal enhancement and delayed excretion of contrast. Both ureters appear normal.  Stomach:  Normal.  Small bowel: No small bowel obstruction. Small bowel contrast has reached the jejunum. There is no contrast in the terminal ileum which is decompressed. Inflammatory changes are present along the terminal ileum associated with acute appendicitis.  Colon: Acute appendicitis present. Feculent material is present along the course of the mid appendix (image 63 series 2) with dilation of the appendix. The appendix measures up to 14 mm transversely. There is probably contained perforation given the size of the appendix. This is immediately adjacent to the RIGHT external iliac vessels. Distal colon appears normal.  Pelvic Genitourinary:  Normal urinary bladder.  Peritoneum: Tiny amount of fluid and phlegmon is present in the anatomic pelvis. This is secondary to acute appendicitis.  Vascular/lymphatic: No acute vascular abnormality. Reactive small ileo colic lymph nodes.  Body Wall: Normal.  IMPRESSION: Acute appendicitis. Likely contained perforation of the retrocecal appendix. Significantly, no discrete abscess or intra-abdominal free air.   Electronically Signed   By: Dereck Ligas M.D.   On: 01/13/2015 14:04    Review of Systems  Constitutional: Positive for fever and chills. Negative for weight loss, malaise/fatigue and diaphoresis.  HENT: Negative.   Eyes: Negative.   Respiratory: Positive for cough. Negative for hemoptysis, sputum production, shortness of breath and wheezing.   Cardiovascular: Negative.   Gastrointestinal: Positive for nausea, abdominal pain and constipation. Negative for heartburn, vomiting, diarrhea and blood in stool.  Genitourinary: Positive for  dysuria.  Musculoskeletal: Negative.   Skin: Negative.   Neurological: Negative.  Negative for weakness.  Endo/Heme/Allergies: Negative.   Psychiatric/Behavioral: Positive for depression.       ADHD    Blood pressure 104/77, pulse 123, temperature 98.1 F (36.7 C), resp. rate 18, SpO2 96 %. Physical Exam  Constitutional: He is oriented to person, place, and time.  Low grade fever with tachycardia, BP down  106/54  HR 115    HENT:  Head: Normocephalic and atraumatic.  Nose: Nose normal.  Eyes: Conjunctivae and EOM are normal. Right eye exhibits no discharge. Left eye exhibits no discharge. No scleral icterus.  Neck: Neck supple. No JVD present. No tracheal deviation present. No thyromegaly present.  Cardiovascular: Regular rhythm, normal heart sounds and intact distal pulses.   No murmur heard. Respiratory: Effort normal and breath sounds normal. No respiratory distress. He has no wheezes. He has no rales. He exhibits no tenderness.  GI: Soft. He exhibits no distension (Very tendrer RLQ) and no mass. There is tenderness. There is no rebound and no guarding.  Musculoskeletal: He exhibits no edema.  Neurological: He is alert and oriented to person, place, and time. No cranial nerve deficit.  Skin: Skin is warm and dry. No rash noted. No erythema. No pallor.  Psychiatric: He has a normal mood and affect. His behavior is normal. Judgment and thought content normal.     Assessment/Plan Acute appendicitis with perforation Tobacco use Hypertension ADD Depression  Plan:  Antibiotics, fluids, and taking to the OR ASAP.  Nou Chard 01/13/2015, 2:39 PM

## 2015-01-14 ENCOUNTER — Encounter (HOSPITAL_COMMUNITY): Payer: Self-pay | Admitting: Surgery

## 2015-01-14 MED ORDER — HEPARIN SODIUM (PORCINE) 5000 UNIT/ML IJ SOLN
5000.0000 [IU] | Freq: Three times a day (TID) | INTRAMUSCULAR | Status: DC
  Administered 2015-01-14 – 2015-01-16 (×7): 5000 [IU] via SUBCUTANEOUS
  Filled 2015-01-14 (×9): qty 1

## 2015-01-14 MED ORDER — DIPHENHYDRAMINE HCL 25 MG PO CAPS
25.0000 mg | ORAL_CAPSULE | Freq: Every evening | ORAL | Status: DC | PRN
  Administered 2015-01-15: 25 mg via ORAL
  Filled 2015-01-14: qty 1

## 2015-01-14 NOTE — Progress Notes (Signed)
1 Day Post-Op  Subjective: He's a little sweaty and diaphoretic, still has O2 on.  He says they put it back on so I am assuming he desaturated off O2.  No BS, but he did OK on clears.  Objective: Vital signs in last 24 hours: Temp:  [98.1 F (36.7 C)-100.5 F (38.1 C)] 98.8 F (37.1 C) (06/10 0600) Pulse Rate:  [106-136] 109 (06/10 0600) Resp:  [14-28] 18 (06/10 0600) BP: (101-141)/(67-110) 106/70 mmHg (06/10 0600) SpO2:  [85 %-99 %] 98 % (06/10 0600) Weight:  [70.308 kg (155 lb)] 70.308 kg (155 lb) (06/10 0600) Last BM Date: 01/11/15 Good urine output Tm 100.5 No labs Intake/Output from previous day: 06/09 0701 - 06/10 0700 In: 3363.8 [I.V.:3363.8] Out: 1175 [Urine:975; Blood:200] Intake/Output this shift: Total I/O In: -  Out: 700 [Urine:700]  General appearance: alert, cooperative and no distress Resp: clear to auscultation bilaterally GI: soft sore, site are dry , no BS.  No flatus.  Lab Results:   Recent Labs  01/13/15 1012  WBC 17.1*  HGB 15.1  HCT 43.9  PLT 194    BMET  Recent Labs  01/13/15 1012  NA 136  K 4.2  CL 100*  CO2 25  GLUCOSE 91  BUN 15  CREATININE 1.08  CALCIUM 9.6   PT/INR No results for input(s): LABPROT, INR in the last 72 hours.   Recent Labs Lab 01/13/15 1012  AST 20  ALT 11*  ALKPHOS 108  BILITOT 0.7  PROT 8.6*  ALBUMIN 4.6     Lipase     Component Value Date/Time   LIPASE 17* 01/13/2015 1012     Studies/Results: Ct Abdomen Pelvis W Contrast  01/13/2015   CLINICAL DATA:  RIGHT-sided abdominal pain radiating to the RIGHT flank. Initial encounter. Nausea and dysuria.  EXAM: CT ABDOMEN AND PELVIS WITH CONTRAST  TECHNIQUE: Multidetector CT imaging of the abdomen and pelvis was performed using the standard protocol following bolus administration of intravenous contrast.  CONTRAST:  OMNIPAQUE IOHEXOL 300 MG/ML SOLN, 40mL OMNIPAQUE IOHEXOL 300 MG/ML SOLN  COMPARISON:  None.  FINDINGS: Musculoskeletal: L5-S1 PLIF  with solid fusion. Grade I retrolisthesis of L4 on L5. Incidental synovial herniation cyst in the anterior LEFT femoral neck.  Lung Bases: Dependent atelectasis.  Liver:  Normal.  Spleen:  Normal.  Gallbladder:  Normal.  Common bile duct:  Normal.  Pancreas:  Normal.  Adrenal glands:  Normal bilaterally.  Kidneys: Normal renal enhancement and delayed excretion of contrast. Both ureters appear normal.  Stomach:  Normal.  Small bowel: No small bowel obstruction. Small bowel contrast has reached the jejunum. There is no contrast in the terminal ileum which is decompressed. Inflammatory changes are present along the terminal ileum associated with acute appendicitis.  Colon: Acute appendicitis present. Feculent material is present along the course of the mid appendix (image 63 series 2) with dilation of the appendix. The appendix measures up to 14 mm transversely. There is probably contained perforation given the size of the appendix. This is immediately adjacent to the RIGHT external iliac vessels. Distal colon appears normal.  Pelvic Genitourinary:  Normal urinary bladder.  Peritoneum: Tiny amount of fluid and phlegmon is present in the anatomic pelvis. This is secondary to acute appendicitis.  Vascular/lymphatic: No acute vascular abnormality. Reactive small ileo colic lymph nodes.  Body Wall: Normal.  IMPRESSION: Acute appendicitis. Likely contained perforation of the retrocecal appendix. Significantly, no discrete abscess or intra-abdominal free air.   Electronically Signed   By: Juliene Pina  Lamke M.D.   On: 01/13/2015 14:04    Medications: . amphetamine-dextroamphetamine  20 mg Oral 2 times per day  . atenolol  50 mg Oral Daily  . DULoxetine  30 mg Oral Daily  . piperacillin-tazobactam (ZOSYN)  IV  3.375 g Intravenous Q8H  . sodium chloride  1,000 mL Intravenous Once    Assessment/Plan Acute appendicitis with perforation S/p laparoscopic appendectomy 01/13/15, Dr. Gerrit Friends Tobacco  use Hypertension ADD Depression Antibiotics day 2 Zosyn  -  He will need to go home on antibiotics DVT:  SCD/heparin tonight   Plan:  Leave him on clears for now, I expect and ileus for a time.  Mobilize and check labs in AM.  Hopefully home later this week.    LOS: 1 day    Sean Flores 01/14/2015

## 2015-01-14 NOTE — Care Management Note (Signed)
Case Management Note  Patient Details  Name: Sean Flores MRN: [id number] Date of Birth: 1965/08/08  Subjective/Objective:  49 y/o m admitted w/perforated appendix.From home. Has no insurance,no pcp. Provided w/pcp listing, encouraged [facility],$4 [facility] med list, insurance info resource list.  POD#1 lap appy.iv abx.                Action/Plan:d/c home.   Expected Discharge Date:   (unknown)               Expected Discharge Plan:  Home/Self Care  In-House Referral:  PCP / [facility]  Discharge planning Services  CM Consult, Indigent Health Clinic, Medication Assistance  Post Acute Care Choice:    Choice offered to:     DME Arranged:    DME Agency:     HH Arranged:    HH Agency:     Status of Service:  In process, will continue to follow  Medicare Important Message Given:    Date Medicare IM Given:    Medicare IM give by:    Date Additional Medicare IM Given:    Additional Medicare Important Message give by:     If discussed at Long Length of Stay Meetings, dates discussed:    Additional Comments:  Lanier Clam, RN 01/14/2015, 5:12 PM

## 2015-01-15 ENCOUNTER — Encounter (HOSPITAL_COMMUNITY): Payer: Self-pay | Admitting: Surgery

## 2015-01-15 DIAGNOSIS — F909 Attention-deficit hyperactivity disorder, unspecified type: Secondary | ICD-10-CM

## 2015-01-15 DIAGNOSIS — I10 Essential (primary) hypertension: Secondary | ICD-10-CM | POA: Diagnosis present

## 2015-01-15 HISTORY — DX: Attention-deficit hyperactivity disorder, unspecified type: F90.9

## 2015-01-15 LAB — BASIC METABOLIC PANEL
Anion gap: 6 (ref 5–15)
BUN: 9 mg/dL (ref 6–20)
CO2: 25 mmol/L (ref 22–32)
Calcium: 8.7 mg/dL — ABNORMAL LOW (ref 8.9–10.3)
Chloride: 103 mmol/L (ref 101–111)
Creatinine, Ser: 0.8 mg/dL (ref 0.61–1.24)
GFR calc Af Amer: >60 mL/min (ref >60)
Glucose, Bld: 113 mg/dL — ABNORMAL HIGH (ref 65–99)
Potassium: 4.6 mmol/L (ref 3.5–5.1)
Sodium: 134 mmol/L — ABNORMAL LOW (ref 135–145)

## 2015-01-15 LAB — CBC
HCT: 34.7 % — ABNORMAL LOW (ref 39.0–52.0)
Hemoglobin: 11.3 g/dL — ABNORMAL LOW (ref 13.0–17.0)
MCH: 33.4 pg (ref 26.0–34.0)
MCHC: 32.6 g/dL (ref 30.0–36.0)
MCV: 102.7 fL — ABNORMAL HIGH (ref 78.0–100.0)
PLATELETS: 149 10*3/uL — AB (ref 150–400)
RBC: 3.38 MIL/uL — AB (ref 4.22–5.81)
RDW: 12.7 % (ref 11.5–15.5)
WBC: 11 10*3/uL — AB (ref 4.0–10.5)

## 2015-01-15 MED ORDER — MENTHOL 3 MG MT LOZG: 1.0000 | LOZENGE | OROMUCOSAL | Status: DC | PRN

## 2015-01-15 MED ORDER — NICOTINE 21 MG/24HR TD PT24
21.0000 mg | MEDICATED_PATCH | Freq: Every day | TRANSDERMAL | Status: DC
  Administered 2015-01-15 – 2015-01-16 (×2): 21 mg via TRANSDERMAL
  Filled 2015-01-15 (×2): qty 1

## 2015-01-15 MED ORDER — PROMETHAZINE HCL 25 MG/ML IJ SOLN: 6.2500 mg | INTRAMUSCULAR | Status: DC | PRN

## 2015-01-15 MED ORDER — LIP MEDEX EX OINT
TOPICAL_OINTMENT | CUTANEOUS | Status: AC
  Administered 2015-01-15: 1 via TOPICAL
  Filled 2015-01-15: qty 7

## 2015-01-15 MED ORDER — PHENOL 1.4 % MT LIQD: 2.0000 | OROMUCOSAL | Status: DC | PRN

## 2015-01-15 MED ORDER — ACETAMINOPHEN 650 MG RE SUPP: 650.0000 mg | Freq: Four times a day (QID) | RECTAL | Status: DC | PRN

## 2015-01-15 MED ORDER — SODIUM CHLORIDE 0.9 % IV SOLN: 250.0000 mL | INTRAVENOUS | Status: DC | PRN

## 2015-01-15 MED ORDER — MAGIC MOUTHWASH
15.0000 mL | Freq: Four times a day (QID) | ORAL | Status: DC | PRN
  Filled 2015-01-15: qty 15

## 2015-01-15 MED ORDER — ACETAMINOPHEN 325 MG PO TABS: 325.0000 mg | ORAL_TABLET | Freq: Four times a day (QID) | ORAL | Status: DC | PRN

## 2015-01-15 MED ORDER — BISACODYL 10 MG RE SUPP: 10.0000 mg | Freq: Two times a day (BID) | RECTAL | Status: DC | PRN

## 2015-01-15 MED ORDER — LIP MEDEX EX OINT
1.0000 "application " | TOPICAL_OINTMENT | Freq: Two times a day (BID) | CUTANEOUS | Status: DC
  Administered 2015-01-15 – 2015-01-16 (×2): 1 via TOPICAL

## 2015-01-15 MED ORDER — PNEUMOCOCCAL VAC POLYVALENT 25 MCG/0.5ML IJ INJ
0.5000 mL | INJECTION | INTRAMUSCULAR | Status: DC | PRN
  Filled 2015-01-15: qty 0.5

## 2015-01-15 MED ORDER — SODIUM CHLORIDE 0.9 % IJ SOLN
3.0000 mL | Freq: Two times a day (BID) | INTRAMUSCULAR | Status: DC
  Administered 2015-01-15: 3 mL via INTRAVENOUS

## 2015-01-15 MED ORDER — NAPROXEN 500 MG PO TABS
500.0000 mg | ORAL_TABLET | Freq: Two times a day (BID) | ORAL | Status: DC
  Administered 2015-01-15 – 2015-01-16 (×2): 500 mg via ORAL
  Filled 2015-01-15 (×4): qty 1

## 2015-01-15 MED ORDER — SODIUM CHLORIDE 0.9 % IJ SOLN: 3.0000 mL | INTRAMUSCULAR | Status: DC | PRN

## 2015-01-15 MED ORDER — LACTATED RINGERS IV BOLUS (SEPSIS): 1000.0000 mL | Freq: Three times a day (TID) | INTRAVENOUS | Status: DC | PRN

## 2015-01-15 MED ORDER — ALUM & MAG HYDROXIDE-SIMETH 200-200-20 MG/5ML PO SUSP: 30.0000 mL | Freq: Four times a day (QID) | ORAL | Status: DC | PRN

## 2015-01-15 MED ORDER — SACCHAROMYCES BOULARDII 250 MG PO CAPS
250.0000 mg | ORAL_CAPSULE | Freq: Two times a day (BID) | ORAL | Status: DC
  Administered 2015-01-15 – 2015-01-16 (×3): 250 mg via ORAL
  Filled 2015-01-15 (×4): qty 1

## 2015-01-15 MED ORDER — DIPHENHYDRAMINE HCL 50 MG/ML IJ SOLN: 12.5000 mg | Freq: Four times a day (QID) | INTRAMUSCULAR | Status: DC | PRN

## 2015-01-15 NOTE — Plan of Care (Signed)
Problem: Phase II Progression Outcomes Goal: Return of bowel function (flatus, BM) IF ABDOMINAL SURGERY:  Outcome: Progressing + flatus; no BM Goal: Tolerating diet Outcome: Progressing Diet progressed to [facility] today.  No problems reported by pt.  Problem: Phase III Progression Outcomes Goal: Activity at appropriate level-compared to baseline (UP IN CHAIR FOR HEMODIALYSIS)  Outcome: Completed/Met Date Met:  01/15/15 Pt ambulating in halls frequently.

## 2015-01-15 NOTE — Progress Notes (Signed)
[facility]  [address] [place], [place] [address] Phone: [phone] FAX: [phone]   VIRAAJ VORNDRAN [id number] Apr 10, 1966   Problem List:   Active Problems:   Acute appendicitis with perforation and peritoneal abscess   Hypertension   ADHD (attention deficit hyperactivity disorder)   2 Days Post-Op 01/13/2015  OPERATIVE REPORT - LAPAROSCOPIC APPENDECTOMY  Preop diagnosis: Acute appendicitis  Postop diagnosis: Acute appendicitis with perforation  Procedure: Laparoscopic appendectomy  Surgeon: Earnstine Regal, MD, FACS  Assessment  Improving  Plan:  -adv diet -wean IVF -Addarall -VTE prophylaxis- SCDs, etc -mobilize as tolerated to help recovery  D/C patient from hospital when patient meets criteria (anticipate in 1-2 day(s)):  Tolerating oral intake well Ambulating well Adequate pain control without IV medications Urinating  Having flatus Disposition planning in place   Adin Hector, M.D., F.A.C.S. Gastrointestinal and Minimally Invasive Surgery [facility] [address], [place], [place] [address] [phone] Main / Paging   01/15/2015  Subjective:  Better Walking Wanting to eat more   Objective:  Vital signs:  Filed Vitals:   01/14/15 1017 01/14/15 1336 01/14/15 2218 01/15/15 0558  BP: 125/87 118/84 123/87 128/94  Pulse: 111 100 98 111  Temp:  98.9 F (37.2 C) 98.8 F (37.1 C) 98.5 F (36.9 C)  TempSrc:  Oral Oral Oral  Resp:  18 18 18   Height:      Weight:      SpO2:  90% 92% 90%    Last BM Date: 01/11/15  Intake/Output   Yesterday:  06/10 0701 - 06/11 0700 In: 2760 [P.O.:960; I.V.:1800] Out: 2050 [Urine:2050] This shift:     Bowel function:  Flatus: y  BM: no  Drain: n/a  Physical Exam:  General: Pt awake/alert/oriented x4 in no acute distress Eyes: PERRL, normal EOM.  Sclera clear.  No icterus Neuro: CN II-XII intact  w/o focal sensory/motor deficits. Lymph: No head/neck/groin lymphadenopathy Psych:  No delerium/psychosis/paranoia HENT: Normocephalic, Mucus membranes moist.  No thrush Neck: Supple, No tracheal deviation Chest: No chest wall pain w good excursion CV:  Pulses intact.  Regular rhythm MS: Normal AROM mjr joints.  No obvious deformity Abdomen: Soft.  Mildly distended.  Incisions c/d/i.  Mildly tender at incisions only.  No evidence of peritonitis.  No incarcerated hernias. Ext:  SCDs BLE.  No mjr edema.  No cyanosis Skin: No petechiae / purpura  Results:   Labs: Results for orders placed or performed during the hospital encounter of 01/13/15 (from the past 48 hour(s))  CBC with Differential     Status: Abnormal   Collection Time: 01/13/15 10:12 AM  Result Value Ref Range   WBC 17.1 (H) 4.0 - 10.5 K/uL   RBC 4.27 4.22 - 5.81 MIL/uL   Hemoglobin 15.1 13.0 - 17.0 g/dL   HCT 43.9 39.0 - 52.0 %   MCV 102.8 (H) 78.0 - 100.0 fL   MCH 35.4 (H) 26.0 - 34.0 pg   MCHC 34.4 30.0 - 36.0 g/dL   RDW 12.7 11.5 - 15.5 %   Platelets 194 150 - 400 K/uL   Neutrophils Relative % 80 (H) 43 - 77 %   Neutro Abs 13.7 (H) 1.7 - 7.7 K/uL   Lymphocytes Relative 12 12 - 46 %   Lymphs Abs 2.1 0.7 - 4.0 K/uL   Monocytes Relative 7 3 - 12 %   Monocytes Absolute 1.2 (H) 0.1 - 1.0 K/uL   Eosinophils Relative  1 0 - 5 %   Eosinophils Absolute 0.1 0.0 - 0.7 K/uL   Basophils Relative 0 0 - 1 %   Basophils Absolute 0.0 0.0 - 0.1 K/uL  Comprehensive metabolic panel     Status: Abnormal   Collection Time: 01/13/15 10:12 AM  Result Value Ref Range   Sodium 136 135 - 145 mmol/L   Potassium 4.2 3.5 - 5.1 mmol/L   Chloride 100 (L) 101 - 111 mmol/L   CO2 25 22 - 32 mmol/L   Glucose, Bld 91 65 - 99 mg/dL   BUN 15 6 - 20 mg/dL   Creatinine, Ser 1.08 0.61 - 1.24 mg/dL   Calcium 9.6 8.9 - 10.3 mg/dL   Total Protein 8.6 (H) 6.5 - 8.1 g/dL   Albumin 4.6 3.5 - 5.0 g/dL   AST 20 15 - 41 U/L   ALT 11 (L) 17 - 63 U/L    Alkaline Phosphatase 108 38 - 126 U/L   Total Bilirubin 0.7 0.3 - 1.2 mg/dL   GFR calc non Af Amer >60 >60 mL/min   GFR calc Af Amer >60 >60 mL/min    Comment: (NOTE) The eGFR has been calculated using the CKD EPI equation. This calculation has not been validated in all clinical situations. eGFR's persistently <60 mL/min signify possible Chronic Kidney Disease.    Anion gap 11 5 - 15  Lipase, blood     Status: Abnormal   Collection Time: 01/13/15 10:12 AM  Result Value Ref Range   Lipase 17 (L) 22 - 51 U/L  Ethanol     Status: None   Collection Time: 01/13/15 10:13 AM  Result Value Ref Range   Alcohol, Ethyl (B) <5 <5 mg/dL    Comment:        LOWEST DETECTABLE LIMIT FOR SERUM ALCOHOL IS 5 mg/dL FOR MEDICAL PURPOSES ONLY   CBC     Status: Abnormal   Collection Time: 01/15/15  4:56 AM  Result Value Ref Range   WBC 11.0 (H) 4.0 - 10.5 K/uL   RBC 3.38 (L) 4.22 - 5.81 MIL/uL   Hemoglobin 11.3 (L) 13.0 - 17.0 g/dL    Comment: DELTA CHECK NOTED REPEATED TO VERIFY    HCT 34.7 (L) 39.0 - 52.0 %   MCV 102.7 (H) 78.0 - 100.0 fL   MCH 33.4 26.0 - 34.0 pg   MCHC 32.6 30.0 - 36.0 g/dL   RDW 12.7 11.5 - 15.5 %   Platelets 149 (L) 150 - 400 K/uL  Basic metabolic panel     Status: Abnormal   Collection Time: 01/15/15  4:56 AM  Result Value Ref Range   Sodium 134 (L) 135 - 145 mmol/L   Potassium 4.6 3.5 - 5.1 mmol/L   Chloride 103 101 - 111 mmol/L   CO2 25 22 - 32 mmol/L   Glucose, Bld 113 (H) 65 - 99 mg/dL   BUN 9 6 - 20 mg/dL   Creatinine, Ser 0.80 0.61 - 1.24 mg/dL   Calcium 8.7 (L) 8.9 - 10.3 mg/dL   GFR calc non Af Amer >60 >60 mL/min   GFR calc Af Amer >60 >60 mL/min    Comment: (NOTE) The eGFR has been calculated using the CKD EPI equation. This calculation has not been validated in all clinical situations. eGFR's persistently <60 mL/min signify possible Chronic Kidney Disease.    Anion gap 6 5 - 15    Imaging / Studies: Ct Abdomen Pelvis W Contrast  01/13/2015  CLINICAL DATA:  RIGHT-sided abdominal pain radiating to the RIGHT flank. Initial encounter. Nausea and dysuria.  EXAM: CT ABDOMEN AND PELVIS WITH CONTRAST  TECHNIQUE: Multidetector CT imaging of the abdomen and pelvis was performed using the standard protocol following bolus administration of intravenous contrast.  CONTRAST:  128m OMNIPAQUE IOHEXOL 300 MG/ML SOLN, 560mOMNIPAQUE IOHEXOL 300 MG/ML SOLN  COMPARISON:  None.  FINDINGS: Musculoskeletal: L5-S1 PLIF with solid fusion. Grade I retrolisthesis of L4 on L5. Incidental synovial herniation cyst in the anterior LEFT femoral neck.  Lung Bases: Dependent atelectasis.  Liver:  Normal.  Spleen:  Normal.  Gallbladder:  Normal.  Common bile duct:  Normal.  Pancreas:  Normal.  Adrenal glands:  Normal bilaterally.  Kidneys: Normal renal enhancement and delayed excretion of contrast. Both ureters appear normal.  Stomach:  Normal.  Small bowel: No small bowel obstruction. Small bowel contrast has reached the jejunum. There is no contrast in the terminal ileum which is decompressed. Inflammatory changes are present along the terminal ileum associated with acute appendicitis.  Colon: Acute appendicitis present. Feculent material is present along the course of the mid appendix (image 63 series 2) with dilation of the appendix. The appendix measures up to 14 mm transversely. There is probably contained perforation given the size of the appendix. This is immediately adjacent to the RIGHT external iliac vessels. Distal colon appears normal.  Pelvic Genitourinary:  Normal urinary bladder.  Peritoneum: Tiny amount of fluid and phlegmon is present in the anatomic pelvis. This is secondary to acute appendicitis.  Vascular/lymphatic: No acute vascular abnormality. Reactive small ileo colic lymph nodes.  Body Wall: Normal.  IMPRESSION: Acute appendicitis. Likely contained perforation of the retrocecal appendix. Significantly, no discrete abscess or intra-abdominal free air.    Electronically Signed   By: GeDereck Ligas.D.   On: 01/13/2015 14:04    Medications / Allergies: per chart  Antibiotics: Anti-infectives    Start     Dose/Rate Route Frequency Ordered Stop   01/13/15 2200  piperacillin-tazobactam (ZOSYN) IVPB 3.375 g     3.375 g 12.5 mL/hr over 240 Minutes Intravenous Every 8 hours 01/13/15 1528     01/13/15 1845  piperacillin-tazobactam (ZOSYN) IVPB 3.375 g  Status:  Discontinued     3.375 g 12.5 mL/hr over 240 Minutes Intravenous Every 8 hours 01/13/15 1844 01/13/15 1849   01/13/15 1530  piperacillin-tazobactam (ZOSYN) IVPB 3.375 g  Status:  Discontinued     3.375 g 100 mL/hr over 30 Minutes Intravenous  Once 01/13/15 1528 01/13/15 1529   01/13/15 1345  piperacillin-tazobactam (ZOSYN) IVPB 3.375 g     3.375 g 100 mL/hr over 30 Minutes Intravenous  Once 01/13/15 1340 01/13/15 1524        Note: Portions of this report may have been transcribed using voice recognition software. Every effort was made to ensure accuracy; however, inadvertent computerized transcription errors may be present.   Any transcriptional errors that result from this process are unintentional.     StAdin HectorM.D., F.A.C.S. Gastrointestinal and Minimally Invasive [facility] [address] [phone] / Paging   01/15/2015  CARE TEAM:  PCP: No PCP Per Patient  Outpatient Care Team: Patient Care Team: No Pcp Per Patient as PCP - General (General Practice)  Inpatient Treatment Team: Treatment Team: Attending Provider: MdNolon NationsMD; Consulting Physician: Md CcEdison PaceMD; Technician: NaTomie ChinaNT; Registered Nurse: PaMortimer FriesRN

## 2015-01-16 MED ORDER — SENNA 8.6 MG PO TABS: 2.0000 | ORAL_TABLET | Freq: Every day | ORAL | Status: DC | PRN

## 2015-01-16 MED ORDER — SODIUM CHLORIDE 0.9 % IJ SOLN: 3.0000 mL | Freq: Two times a day (BID) | INTRAMUSCULAR | Status: DC

## 2015-01-16 MED ORDER — SODIUM CHLORIDE 0.9 % IJ SOLN: 3.0000 mL | INTRAMUSCULAR | Status: DC | PRN

## 2015-01-16 MED ORDER — OXYCODONE HCL 5 MG PO TABS: 5.0000 mg | ORAL_TABLET | ORAL | Status: DC | PRN

## 2015-01-16 MED ORDER — SODIUM CHLORIDE 0.9 % IV SOLN: 250.0000 mL | INTRAVENOUS | Status: DC | PRN

## 2015-01-16 MED ORDER — ACETAMINOPHEN 500 MG PO TABS
1000.0000 mg | ORAL_TABLET | Freq: Three times a day (TID) | ORAL | Status: DC
  Administered 2015-01-16 (×2): 1000 mg via ORAL
  Filled 2015-01-16 (×2): qty 2

## 2015-01-16 MED ORDER — TRAMADOL HCL 50 MG PO TABS: 50.0000 mg | ORAL_TABLET | Freq: Four times a day (QID) | ORAL | Status: DC | PRN

## 2015-01-16 MED ORDER — TRAMADOL HCL 50 MG PO TABS: 50.0000 mg | ORAL_TABLET | Freq: Four times a day (QID) | ORAL | Status: AC | PRN

## 2015-01-16 MED ORDER — IBUPROFEN 800 MG PO TABS
800.0000 mg | ORAL_TABLET | Freq: Four times a day (QID) | ORAL | Status: DC
  Administered 2015-01-16: 800 mg via ORAL
  Filled 2015-01-16: qty 1

## 2015-01-16 MED ORDER — ASPIRIN EC 81 MG PO TBEC
162.0000 mg | DELAYED_RELEASE_TABLET | Freq: Every day | ORAL | Status: DC
  Administered 2015-01-16: 162 mg via ORAL
  Filled 2015-01-16: qty 2

## 2015-01-16 MED ORDER — AMOXICILLIN-POT CLAVULANATE 875-125 MG PO TABS: 1.0000 | ORAL_TABLET | Freq: Two times a day (BID) | ORAL | Status: AC

## 2015-01-16 MED ORDER — FENTANYL CITRATE (PF) 100 MCG/2ML IJ SOLN: 25.0000 ug | INTRAMUSCULAR | Status: DC | PRN

## 2015-01-16 NOTE — Discharge Instructions (Signed)
LAPAROSCOPIC SURGERY: POST OP INSTRUCTIONS ° °1. DIET: Follow a light bland diet the first 24 hours after arrival home, such as soup, liquids, crackers, etc.  Be sure to include lots of fluids daily.  Avoid fast food or heavy meals as your are more likely to get nauseated.  Eat a low fat the next few days after surgery.   °2. Take your usually prescribed home medications unless otherwise directed. °3. PAIN CONTROL: °a. Pain is best controlled by a usual combination of three different methods TOGETHER: °i. Ice/Heat °ii. Over the counter pain medication °iii. Prescription pain medication °b. Most patients will experience some swelling and bruising around the incisions.  Ice packs or heating pads (30-60 minutes up to 6 times a day) will help. Use ice for the first few days to help decrease swelling and bruising, then switch to heat to help relax tight/sore spots and speed recovery.  Some people prefer to use ice alone, heat alone, alternating between ice & heat.  Experiment to what works for you.  Swelling and bruising can take several weeks to resolve.   °c. It is helpful to take an over-the-counter pain medication regularly for the first few weeks.  Choose one of the following that works best for you: °i. Naproxen (Aleve, etc)  Two 220mg tabs twice a day °ii. Ibuprofen (Advil, etc) Three 200mg tabs four times a day (every meal & bedtime) °iii. Acetaminophen (Tylenol, etc) 500-650mg four times a day (every meal & bedtime) °d. A  prescription for pain medication (such as oxycodone, hydrocodone, etc) should be given to you upon discharge.  Take your pain medication as prescribed.  °i. If you are having problems/concerns with the prescription medicine (does not control pain, nausea, vomiting, rash, itching, etc), please call us [phone] to see if we need to switch you to a different pain medicine that will work better for you and/or control your side effect better. °ii. If you need a refill on your pain medication,  please contact your pharmacy.  They will contact our office to request authorization. Prescriptions will not be filled after 5 pm or on week-ends. °4. Avoid getting constipated.  Between the surgery and the pain medications, it is common to experience some constipation.  Increasing fluid intake and taking a fiber supplement (such as Metamucil, Citrucel, FiberCon, MiraLax, etc) 1-2 times a day regularly will usually help prevent this problem from occurring.  A mild laxative (prune juice, Milk of Magnesia, MiraLax, etc) should be taken according to package directions if there are no bowel movements after 48 hours.   °5. Watch out for diarrhea.  If you have many loose bowel movements, simplify your diet to bland foods & liquids for a few days.  Stop any stool softeners and decrease your fiber supplement.  Switching to mild anti-diarrheal medications (Kayopectate, Pepto Bismol) can help.  If this worsens or does not improve, please call us. °6. Wash / shower every day.  You may shower over the dressings as they are waterproof.  Continue to shower over incision(s) after the dressing is off. °7. Remove your waterproof bandages 5 days after surgery.  You may leave the incision open to air.  You may replace a dressing/Band-Aid to cover the incision for comfort if you wish.  °8. ACTIVITIES as tolerated:   °a. You may resume regular (light) daily activities beginning the next day--such as daily self-care, walking, climbing stairs--gradually increasing activities as tolerated.  If you can walk 30 minutes without difficulty, it   is safe to try more intense activity such as jogging, treadmill, bicycling, low-impact aerobics, swimming, etc. °b. Save the most intensive and strenuous activity for last such as sit-ups, heavy lifting, contact sports, etc  Refrain from any heavy lifting or straining until you are off narcotics for pain control.   °c. DO NOT PUSH THROUGH PAIN.  Let pain be your guide: If it hurts to do something, don't  do it.  Pain is your body warning you to avoid that activity for another week until the pain goes down. °d. You may drive when you are no longer taking prescription pain medication, you can comfortably wear a seatbelt, and you can safely maneuver your car and apply brakes. °e. You may have sexual intercourse when it is comfortable.  °9. FOLLOW UP in our office °a. Please call [facility] at [phone] to set up an appointment to see your surgeon in the office for a follow-up appointment approximately 2-3 weeks after your surgery. °b. Make sure that you call for this appointment the day you arrive home to insure a convenient appointment time. °10. IF YOU HAVE DISABILITY OR FAMILY LEAVE FORMS, BRING THEM TO THE OFFICE FOR PROCESSING.  DO NOT GIVE THEM TO YOUR DOCTOR. ° ° °WHEN TO CALL US [phone]: °1. Poor pain control °2. Reactions / problems with new medications (rash/itching, nausea, etc)  °3. Fever over 101.5 F (38.5 C) °4. Inability to urinate °5. Nausea and/or vomiting °6. Worsening swelling or bruising °7. Continued bleeding from incision. °8. Increased pain, redness, or drainage from the incision ° ° The clinic staff is available to answer your questions during regular business hours (8:30am-5pm).  Please don’t hesitate to call and ask to speak to one of our nurses for clinical concerns.  ° If you have a medical emergency, go to the nearest emergency room or call 911. ° A surgeon from [facility] is always on call at the hospitals ° ° °[facility] °[address], [address], [place], [place]  [address] ? °MAIN: [phone] ? TOLL FREE: [phone] ?  °FAX [phone] °[email or web address] ° °GETTING TO GOOD BOWEL HEALTH. °Irregular bowel habits such as constipation and diarrhea can lead to many problems over time.  Having one soft bowel movement a day is the most important way to prevent further problems.  The anorectal canal is designed to handle  stretching and feces to safely manage our ability to get rid of solid waste (feces, poop, stool) out of our body.  BUT, hard constipated stools can act like ripping concrete bricks and diarrhea can be a burning fire to this very sensitive area of our body, causing inflamed hemorrhoids, anal fissures, increasing risk is perirectal abscesses, abdominal pain/bloating, an making irritable bowel worse.     ° °The goal: ONE SOFT BOWEL MOVEMENT A DAY!  To have soft, regular bowel movements:  °• Drink plenty of fluids, consider 4-6 tall glasses of water a day.   °• Take plenty of fiber.  Fiber is the undigested part of plant food that passes into the colon, acting s “natures broom” to encourage bowel motility and movement.  Fiber can absorb and hold large amounts of water. This results in a larger, bulkier stool, which is soft and easier to pass. Work gradually over several weeks up to 6 servings a day of fiber (25g a day even more if needed) in the form of: °o Vegetables -- Root (potatoes, carrots, turnips), leafy green (lettuce, salad greens,   celery, spinach), or cooked high residue (cabbage, broccoli, etc) °o Fruit -- Fresh (unpeeled skin & pulp), Dried (prunes, apricots, cherries, etc ),  or stewed ( applesauce)  °o Whole grain breads, pasta, etc (whole wheat)  °o Bran cereals  °• Bulking Agents -- This type of water-retaining fiber generally is easily obtained each day by one of the following:  °o Psyllium bran -- The psyllium plant is remarkable because its ground seeds can retain so much water. This product is available as Metamucil, Konsyl, Effersyllium, Per Diem Fiber, or the less expensive generic preparation in drug and health food stores. Although labeled a laxative, it really is not a laxative.  °o Methylcellulose -- This is another fiber derived from wood which also retains water. It is available as Citrucel. °o Polyethylene Glycol - and “artificial” fiber commonly called Miralax or Glycolax.  It is helpful  for people with gassy or bloated feelings with regular fiber °o Flax Seed - a less gassy fiber than psyllium °• No reading or other relaxing activity while on the toilet. If bowel movements take longer than 5 minutes, you are too constipated °• AVOID CONSTIPATION.  High fiber and water intake usually takes care of this.  Sometimes a laxative is needed to stimulate more frequent bowel movements, but  °• Laxatives are not a good long-term solution as it can wear the colon out.  They can help jump-start bowels if constipated, but should be relied on constantly without discussing with your doctor °o Osmotics (Milk of Magnesia, Fleets phosphosoda, Magnesium citrate, MiraLax, GoLytely) are safer than  °o Stimulants (Senokot, Castor Oil, Dulcolax, Ex Lax)    °o Avoid taking laxatives for more than 7 days in a row. °•  IF SEVERELY CONSTIPATED, try a Bowel Retraining Program: °o Do not use laxatives.  °o Eat a diet high in roughage, such as bran cereals and leafy vegetables.  °o Drink six (6) ounces of prune or apricot juice each morning.  °o Eat two (2) large servings of stewed fruit each day.  °o Take one (1) heaping tablespoon of a psyllium-based bulking agent twice a day. Use sugar-free sweetener when possible to avoid excessive calories.  °o Eat a normal breakfast.  °o Set aside 15 minutes after breakfast to sit on the toilet, but do not strain to have a bowel movement.  °o If you do not have a bowel movement by the third day, use an enema and repeat the above steps.  °• Controlling diarrhea °o Switch to liquids and simpler foods for a few days to avoid stressing your intestines further. °o Avoid dairy products (especially milk & ice cream) for a short time.  The intestines often can lose the ability to digest lactose when stressed. °o Avoid foods that cause gassiness or bloating.  Typical foods include beans and other legumes, cabbage, broccoli, and dairy foods.  Every person has some sensitivity to other foods, so  listen to our body and avoid those foods that trigger problems for you. °o Adding fiber (Citrucel, Metamucil, psyllium, Miralax) gradually can help thicken stools by absorbing excess fluid and retrain the intestines to act more normally.  Slowly increase the dose over a few weeks.  Too much fiber too soon can backfire and cause cramping & bloating. °o Probiotics (such as active yogurt, Align, etc) may help repopulate the intestines and colon with normal bacteria and calm down a sensitive digestive tract.  Most studies show it to be of mild help, though, and such products   can be costly. o Medicines: - Bismuth subsalicylate (ex. Kayopectate, Pepto Bismol) every 30 minutes for up to 6 doses can help control diarrhea.  Avoid if pregnant. - Loperamide (Immodium) can slow down diarrhea.  Start with two tablets (35m total) first and then try one tablet every 6 hours.  Avoid if you are having fevers or severe pain.  If you are not better or start feeling worse, stop all medicines and call your doctor for advice o Call your doctor if you are getting worse or not better.  Sometimes further testing (cultures, endoscopy, X-ray studies, bloodwork, etc) may be needed to help diagnose and treat the cause of the diarrhea.  TROUBLESHOOTING IRREGULAR BOWELS 1) Avoid extremes of bowel movements (no bad constipation/diarrhea) 2) Miralax 17gm mixed in 8oz. water or juice-daily. May use BID as needed.  3) Gas-x,Phazyme, etc. as needed for gas & bloating.  4) Soft,bland diet. No spicy,greasy,fried foods.  5) Prilosec over-the-counter as needed  6) May hold gluten/wheat products from diet to see if symptoms improve.  7)  May try probiotics (Align, Activa, etc) to help calm the bowels down 7) If symptoms become worse call back immediately.  Managing Pain  Pain after surgery or related to activity is often due to strain/injury to muscle, tendon, nerves and/or incisions.  This pain is usually short-term and will improve in a  few months.   Many people find it helpful to do the following things TOGETHER to help speed the process of healing and to get back to regular activity more quickly:  1. Avoid heavy physical activity at first a. No lifting greater than 20 pounds at first, then increase to lifting as tolerated over the next few weeks b. Do not push through the pain.  Listen to your body and avoid positions and maneuvers than reproduce the pain.  Wait a few days before trying something more intense c. Walking is okay as tolerated, but go slowly and stop when getting sore.  If you can walk 30 minutes without stopping or pain, you can try more intense activity (running, jogging, aerobics, cycling, swimming, treadmill, sex, sports, weightlifting, etc ) d. Remember: If it hurts to do it, then dont do it!  2. Take Anti-inflammatory medication a. Choose ONE of the following over-the-counter medications: i.            Acetaminophen 5046mtabs (Tylenol) 1-2 pills with every meal and just before bedtime (avoid if you have liver problems) ii.            Naproxen 22053mabs (ex. Aleve) 1-2 pills twice a day (avoid if you have kidney, stomach, IBD, or bleeding problems) iii. Ibuprofen 200m40mbs (ex. Advil, Motrin) 3-4 pills with every meal and just before bedtime (avoid if you have kidney, stomach, IBD, or bleeding problems) b. Take with food/snack around the clock for 1-2 weeks i. This helps the muscle and nerve tissues become less irritable and calm down faster  3. Use a Heating pad or Ice/Cold Pack a. 4-6 times a day b. May use warm bath/hottub  or showers  4. Try Gentle Massage and/or Stretching  a. at the area of pain many times a day b. stop if you feel pain - do not overdo it  Try these steps together to help you body heal faster and avoid making things get worse.  Doing just one of these things may not be enough.    If you are not getting better after two weeks or are noticing  you are getting worse, contact  our office for further advice; we may need to re-evaluate you & see what other things we can do to help.   Appendicitis Appendicitis is when the appendix is swollen (inflamed). The inflammation can lead to developing a hole (perforation) and a collection of pus (abscess). CAUSES  There is not always an obvious cause of appendicitis. Sometimes it is caused by an obstruction in the appendix. The obstruction can be caused by:  A small, hard, pea-sized ball of stool (fecalith).  Enlarged lymph glands in the appendix. SYMPTOMS   Pain around your belly button (navel) that moves toward your lower right belly (abdomen). The pain can become more severe and sharp as time passes.  Tenderness in the lower right abdomen. Pain gets worse if you cough or make a sudden movement.  Feeling sick to your stomach (nauseous).  Throwing up (vomiting).  Loss of appetite.  Fever.  Constipation.  Diarrhea.  Generally not feeling well. DIAGNOSIS   Physical exam.  Blood tests.  Urine test.  X-rays or a CT scan may confirm the diagnosis. TREATMENT  Once the diagnosis of appendicitis is made, the most common treatment is to remove the appendix as soon as possible. This procedure is called appendectomy. In an open appendectomy, a cut (incision) is made in the lower right abdomen and the appendix is removed. In a laparoscopic appendectomy, usually 3 small incisions are made. Long, thin instruments and a camera tube are used to remove the appendix. Most patients go home in 24 to 48 hours after appendectomy. In some situations, the appendix may have already perforated and an abscess may have formed. The abscess may have a "wall" around it as seen on a CT scan. In this case, a drain may be placed into the abscess to remove fluid, and you may be treated with antibiotic medicines that kill germs. The medicine is given through a tube in your vein (IV). Once the abscess has resolved, it may or may not be necessary  to have an appendectomy. You may need to stay in the hospital longer than 48 hours. Document Released: 07/23/2005 Document Revised: 01/22/2012 Document Reviewed: 10/18/2009 [facility] Patient Information 2015 [place], [place]. This information is not intended to replace advice given to you by your health care provider. Make sure you discuss any questions you have with your health care provider.

## 2015-01-16 NOTE — Care Management Note (Signed)
Case Management Note  Patient Details  Name: HINCKLEY MORENA MRN: [id number] Date of Birth: 1965-12-14  Subjective/Objective:        appendectomy            Action/Plan: Home   Expected Discharge Date:  01/16/2015              Expected Discharge Plan:  Home/Self Care  In-House Referral:     Discharge planning Services  CM Consult, Indigent Health Clinic, Medication Assistance   Status of Service:  Completed, signed off  Medicare Important Message Given:  No Date Medicare IM Given:    Medicare IM give by:    Date Additional Medicare IM Given:    Additional Medicare Important Message give by:     If discussed at Long Length of Stay Meetings, dates discussed:    Additional Comments: Consulted for medication assistance. Provided pt with coupon from [employer] for Augementin at [employer], [employer] $18 and [employer] $13. Pt Rx was e-scribed to [employer]. Provided pt with [facility] brochure. He can have Rx transferred to pharmacy of his choice. Explained he can use the [facility] pharmacy once he has set up an appt with clinic on tomorrow. Explained meds run $4 or $10 at the [facility]. Pt will follow up tomorrow on prices.   Elliot Cousin, RN 01/16/2015, 3:09 PM

## 2015-01-16 NOTE — Progress Notes (Signed)
[facility]  [address] [place], [place] [address] Phone: [phone] FAX: [phone]   Sean Flores [phone] [birth year]   Problem List:   Active Problems:   Acute appendicitis with perforation and peritoneal abscess   Hypertension   ADHD (attention deficit hyperactivity disorder)   3 Days Post-Op 01/13/2015  OPERATIVE REPORT - LAPAROSCOPIC APPENDECTOMY  Preop diagnosis: Acute appendicitis  Postop diagnosis: Acute appendicitis with perforation  Procedure: Laparoscopic appendectomy  Surgeon: Earnstine Regal, MD, FACS  Assessment  Improving somewhat  Plan:  -adv diet to solids -change narcotics w nausea -stop IVF -Addarall -VTE prophylaxis- SCDs, etc -mobilize as tolerated to help recovery  D/C patient from hospital when patient meets criteria (anticipate in 0-1 day(s)):  Tolerating oral intake well Ambulating well Adequate pain control without IV medications Urinating  Having flatus Disposition planning in place   Sean Flores, M.D., F.A.C.S. Gastrointestinal and Minimally Invasive Surgery [facility] [address], [place], [place] [address] [phone] Main / Paging   01/16/2015  Subjective:  Better Walking Tol fulls Nauseated a little this AM   Objective:  Vital signs:  Filed Vitals:   01/15/15 1100 01/15/15 1400 01/15/15 2155 01/16/15 0557  BP: 142/97 129/90 136/87 126/82  Pulse: 118 100 105 96  Temp:  98.3 F (36.8 C) 97.7 F (36.5 C) 98.6 F (37 C)  TempSrc:  Oral Oral Other (Comment)  Resp:  _0 Height:      Weight:      SpO2: 89% 91% 92% 91%    Last BM Date: 01/16/15  Intake/Output   Yesterday:  06/11 0701 - 06/12 0700 In: 6440 [P.O.:1680; I.V.:1800; IV Piggyback:250] Out: 2500 [Urine:2500] This shift:     Bowel function:  Flatus: y  BM: YES  Drain: n/a  Physical Exam:  General: Pt awake/alert/oriented x4 in  no acute distress Eyes: PERRL, normal EOM.  Sclera clear.  No icterus Neuro: CN II-XII intact w/o focal sensory/motor deficits. Lymph: No head/neck/groin lymphadenopathy Psych:  No delerium/psychosis/paranoia HENT: Normocephalic, Mucus membranes moist.  No thrush Neck: Supple, No tracheal deviation Chest: No chest wall pain w good excursion CV:  Pulses intact.  Regular rhythm MS: Normal AROM mjr joints.  No obvious deformity Abdomen: Soft.  Less distended.  Incisions c/d/i.  Minimally tender at incisions only.  No evidence of peritonitis.  No incarcerated hernias. Ext:  SCDs BLE.  No mjr edema.  No cyanosis Skin: No petechiae / purpura  Results:   Labs: Results for orders placed or performed during the hospital encounter of 01/13/15 (from the past 48 hour(s))  CBC     Status: Abnormal   Collection Time: 01/15/15  4:56 AM  Result Value Ref Range   WBC 11.0 (H) 4.0 - 10.5 K/uL   RBC 3.38 (L) 4.22 - 5.81 MIL/uL   Hemoglobin 11.3 (L) 13.0 - 17.0 g/dL    Comment: DELTA CHECK NOTED REPEATED TO VERIFY    HCT 34.7 (L) 39.0 - 52.0 %   MCV 102.7 (H) 78.0 - 100.0 fL   MCH 33.4 26.0 - 34.0 pg   MCHC 32.6 30.0 - 36.0 g/dL   RDW 12.7 11.5 - 15.5 %   Platelets 149 (L) 150 - 400 K/uL  Basic metabolic panel     Status: Abnormal   Collection Time: 01/15/15  4:56 AM  Result Value Ref Range   Sodium 134 (L) 135 - 145 mmol/L   Potassium 4.6  3.5 - 5.1 mmol/L   Chloride 103 101 - 111 mmol/L   CO2 25 22 - 32 mmol/L   Glucose, Bld 113 (H) 65 - 99 mg/dL   BUN 9 6 - 20 mg/dL   Creatinine, Ser 0.80 0.61 - 1.24 mg/dL   Calcium 8.7 (L) 8.9 - 10.3 mg/dL   GFR calc non Af Amer >60 >60 mL/min   GFR calc Af Amer >60 >60 mL/min    Comment: (NOTE) The eGFR has been calculated using the CKD EPI equation. This calculation has not been validated in all clinical situations. eGFR's persistently <60 mL/min signify possible Chronic Kidney Disease.    Anion gap 6 5 - 15    Imaging / Studies: No results  found.  Medications / Allergies: per chart  Antibiotics: Anti-infectives    Start     Dose/Rate Route Frequency Ordered Stop   01/13/15 2200  piperacillin-tazobactam (ZOSYN) IVPB 3.375 g     3.375 g 12.5 mL/hr over 240 Minutes Intravenous Every 8 hours 01/13/15 1528     01/13/15 1845  piperacillin-tazobactam (ZOSYN) IVPB 3.375 g  Status:  Discontinued     3.375 g 12.5 mL/hr over 240 Minutes Intravenous Every 8 hours 01/13/15 1844 01/13/15 1849   01/13/15 1530  piperacillin-tazobactam (ZOSYN) IVPB 3.375 g  Status:  Discontinued     3.375 g 100 mL/hr over 30 Minutes Intravenous  Once 01/13/15 1528 01/13/15 1529   01/13/15 1345  piperacillin-tazobactam (ZOSYN) IVPB 3.375 g     3.375 g 100 mL/hr over 30 Minutes Intravenous  Once 01/13/15 1340 01/13/15 1524        Note: Portions of this report may have been transcribed using voice recognition software. Every effort was made to ensure accuracy; however, inadvertent computerized transcription errors may be present.   Any transcriptional errors that result from this process are unintentional.     Sean Flores, M.D., F.A.C.S. Gastrointestinal and Minimally Invasive Surgery [facility] [address] [place], [place] [address] [phone] Main / Paging   01/16/2015  CARE TEAM:  PCP: No PCP Per Patient  Outpatient Care Team: Patient Care Team: No Pcp Per Patient as PCP - General (General Practice)  Inpatient Treatment Team: Treatment Team: Attending Provider: Nolon Nations, MD; Consulting Physician: Md Edison Pace, MD; Technician: Tomie China, NT; Registered Nurse: Mortimer Fries, RN; Registered Nurse: Bailey Mech, RN

## 2015-01-16 NOTE — Plan of Care (Signed)
Problem: Phase II Progression Outcomes Goal: Tolerating diet Outcome: Completed/Met Date Met:  01/16/15  Tolerating [place] breakfast; now trying soft diet.

## 2015-01-16 NOTE — Discharge Summary (Signed)
Physician Discharge Summary  Patient ID: Sean Flores MRN: [id number] DOB/AGE: 10-20-1965 49 y.o.  Admit date: 01/13/2015 Discharge date: 01/16/2015  Patient Care Team: No Pcp Per Patient as PCP - General (General Practice)  Admission Diagnoses: Active Problems:   Acute appendicitis with perforation and peritoneal abscess   Hypertension   ADHD (attention deficit hyperactivity disorder)   Discharge Diagnoses:  Active Problems:   Acute appendicitis with perforation and peritoneal abscess   Hypertension   ADHD (attention deficit hyperactivity disorder)   POST-OPERATIVE DIAGNOSIS:  Appendicitis  SURGERY:  Procedure(s): APPENDECTOMY LAPAROSCOPIC  SURGEON:  Surgeon(s): Armandina Gemma, MD  Consults: None  Hospital Course:   The patient underwent the surgery above for perforated appendicitis.  Postoperatively, the patient gradually mobilized.  Ileus resolved, so he advanced to a solid diet.  Pain, nausea, and other symptoms were treated aggressively.    By the time of discharge, the patient was walking well the hallways, eating food, having flatus & BMs.  Pain was well-controlled on an oral medications.  Based on meeting discharge criteria and continuing to recover, I felt it was safe for the patient to be discharged from the hospital to further recover with close followup. PO ABX x 5 more days.  Postoperative recommendations were discussed in detail.  They are written as well.   Significant Diagnostic Studies:  Results for orders placed or performed during the hospital encounter of 01/13/15 (from the past 72 hour(s))  CBC     Status: Abnormal   Collection Time: 01/15/15  4:56 AM  Result Value Ref Range   WBC 11.0 (H) 4.0 - 10.5 K/uL   RBC 3.38 (L) 4.22 - 5.81 MIL/uL   Hemoglobin 11.3 (L) 13.0 - 17.0 g/dL    Comment: DELTA CHECK NOTED REPEATED TO VERIFY    HCT 34.7 (L) 39.0 - 52.0 %   MCV 102.7 (H) 78.0 - 100.0 fL   MCH 33.4 26.0 - 34.0 pg   MCHC 32.6 30.0 - 36.0 g/dL    RDW 12.7 11.5 - 15.5 %   Platelets 149 (L) 150 - 400 K/uL  Basic metabolic panel     Status: Abnormal   Collection Time: 01/15/15  4:56 AM  Result Value Ref Range   Sodium 134 (L) 135 - 145 mmol/L   Potassium 4.6 3.5 - 5.1 mmol/L   Chloride 103 101 - 111 mmol/L   CO2 25 22 - 32 mmol/L   Glucose, Bld 113 (H) 65 - 99 mg/dL   BUN 9 6 - 20 mg/dL   Creatinine, Ser 0.80 0.61 - 1.24 mg/dL   Calcium 8.7 (L) 8.9 - 10.3 mg/dL   GFR calc non Af Amer >60 >60 mL/min   GFR calc Af Amer >60 >60 mL/min    Comment: (NOTE) The eGFR has been calculated using the CKD EPI equation. This calculation has not been validated in all clinical situations. eGFR's persistently <60 mL/min signify possible Chronic Kidney Disease.    Anion gap 6 5 - 15    Ct Abdomen Pelvis W Contrast  01/13/2015   CLINICAL DATA:  RIGHT-sided abdominal pain radiating to the RIGHT flank. Initial encounter. Nausea and dysuria.  EXAM: CT ABDOMEN AND PELVIS WITH CONTRAST  TECHNIQUE: Multidetector CT imaging of the abdomen and pelvis was performed using the standard protocol following bolus administration of intravenous contrast.  CONTRAST:  178m OMNIPAQUE IOHEXOL 300 MG/ML SOLN, 575mOMNIPAQUE IOHEXOL 300 MG/ML SOLN  COMPARISON:  None.  FINDINGS: Musculoskeletal: L5-S1 PLIF with solid fusion. Grade I  retrolisthesis of L4 on L5. Incidental synovial herniation cyst in the anterior LEFT femoral neck.  Lung Bases: Dependent atelectasis.  Liver:  Normal.  Spleen:  Normal.  Gallbladder:  Normal.  Common bile duct:  Normal.  Pancreas:  Normal.  Adrenal glands:  Normal bilaterally.  Kidneys: Normal renal enhancement and delayed excretion of contrast. Both ureters appear normal.  Stomach:  Normal.  Small bowel: No small bowel obstruction. Small bowel contrast has reached the jejunum. There is no contrast in the terminal ileum which is decompressed. Inflammatory changes are present along the terminal ileum associated with acute appendicitis.  Colon: Acute  appendicitis present. Feculent material is present along the course of the mid appendix (image 63 series 2) with dilation of the appendix. The appendix measures up to 14 mm transversely. There is probably contained perforation given the size of the appendix. This is immediately adjacent to the RIGHT external iliac vessels. Distal colon appears normal.  Pelvic Genitourinary:  Normal urinary bladder.  Peritoneum: Tiny amount of fluid and phlegmon is present in the anatomic pelvis. This is secondary to acute appendicitis.  Vascular/lymphatic: No acute vascular abnormality. Reactive small ileo colic lymph nodes.  Body Wall: Normal.  IMPRESSION: Acute appendicitis. Likely contained perforation of the retrocecal appendix. Significantly, no discrete abscess or intra-abdominal free air.   Electronically Signed   By: Dereck Ligas M.D.   On: 01/13/2015 14:04    Discharge Exam: Blood pressure 133/92, pulse 98, temperature 98.6 F (37 C), temperature source Other (Comment), resp. rate 18, height 5' 6"  (1.676 m), weight 70.308 kg (155 lb), SpO2 91 %.  General: Pt awake/alert/oriented x4 in no major acute distress Eyes: PERRL, normal EOM. Sclera nonicteric Neuro: CN II-XII intact w/o focal sensory/motor deficits. Lymph: No head/neck/groin lymphadenopathy Psych:  No delerium/psychosis/paranoia HENT: Normocephalic, Mucus membranes moist.  No thrush Neck: Supple, No tracheal deviation Chest: No pain.  Good respiratory excursion. CV:  Pulses intact.  Regular rhythm MS: Normal AROM mjr joints.  No obvious deformity Abdomen: Soft, Nondistended.  Min tender.  No incarcerated hernias. Ext:  SCDs BLE.  No significant edema.  No cyanosis Skin: No petechiae / purpura  Discharged Condition: good   Past Medical History  Diagnosis Date  . Hypertension   . ADHD (attention deficit hyperactivity disorder) 01/15/2015    Past Surgical History  Procedure Laterality Date  . Laminectomy    . Laparoscopic appendectomy  N/A 01/13/2015    Procedure: APPENDECTOMY LAPAROSCOPIC;  Surgeon: Armandina Gemma, MD;  Location: [facility];  Service: General;  Laterality: N/A;    History   Social History  . Marital Status: Married    Spouse Name: N/A  . Number of Children: N/A  . Years of Education: N/A   Occupational History  . Not on file.   Social History Main Topics  . Smoking status: Current Every Day Smoker -- 1.00 packs/day for 20 years  . Smokeless tobacco: Never Used  . Alcohol Use: Yes     Comment: 1 glass wine after dinner approx. 3 to4 times  per week  . Drug Use: No  . Sexual Activity: Not on file   Other Topics Concern  . Not on file   Social History Narrative    History reviewed. No pertinent family history.  No current facility-administered medications for this encounter.   Current Outpatient Prescriptions  Medication Sig Dispense Refill  . amphetamine-dextroamphetamine (ADDERALL) 20 MG tablet Take 20 mg by mouth 2 (two) times daily.    Marland Kitchen aspirin EC  81 MG tablet Take 162 mg by mouth daily.    Marland Kitchen atenolol (TENORMIN) 50 MG tablet Take 50 mg by mouth daily.    . diphenhydrAMINE (BENADRYL) 25 MG tablet Take 50 mg by mouth at bedtime as needed for itching or allergies.    . DULoxetine (CYMBALTA) 30 MG capsule Take 30 mg by mouth daily.    . magnesium hydroxide (MILK OF MAGNESIA) 400 MG/5ML suspension Take 30 mLs by mouth daily as needed for mild constipation.    . senna (SENOKOT) 8.6 MG TABS tablet Take 2 tablets by mouth daily as needed for mild constipation.    Marland Kitchen amoxicillin-clavulanate (AUGMENTIN) 875-125 MG per tablet Take 1 tablet by mouth 2 (two) times daily. 10 tablet 1  . traMADol (ULTRAM) 50 MG tablet Take 1-2 tablets (50-100 mg total) by mouth every 6 (six) hours as needed for moderate pain or severe pain. 30 tablet 0     No Known Allergies  Disposition: 01-Home or Self Care  Discharge Instructions    Call MD for:  extreme fatigue    Complete by:  As directed      Call MD for:   hives    Complete by:  As directed      Call MD for:  persistant nausea and vomiting    Complete by:  As directed      Call MD for:  redness, tenderness, or signs of infection (pain, swelling, redness, odor or green/yellow discharge around incision site)    Complete by:  As directed      Call MD for:  severe uncontrolled pain    Complete by:  As directed      Call MD for:    Complete by:  As directed   Temperature > 101.25F     Diet - low sodium heart healthy    Complete by:  As directed      Discharge instructions    Complete by:  As directed   Please see discharge instruction sheets.  Also refer to handout given an office.  Please call our office if you have any questions or concerns [phone]     Discharge wound care:    Complete by:  As directed   If you have closed incisions, shower and bathe over these incisions with soap and water every day.  Remove all surgical dressings on postoperative day #3.  You do not need to replace dressings over the closed incisions unless you feel more comfortable with a Band-Aid covering it.   If you have an open wound that requires packing, please see wound care instructions.  In general, remove all dressings, wash wound with soap and water and then replace with saline moistened gauze.  Do the dressing change at least every day.  Please call our office [phone] if you have further questions.     Driving Restrictions    Complete by:  As directed   No driving until off narcotics and can safely swerve away without pain during an emergency     Increase activity slowly    Complete by:  As directed   Walk an hour a day.  Use 20-30 minute walks.  When you can walk 30 minutes without difficulty, increase to low impact/moderate activities such as biking, jogging, swimming, sexual activity..  Eventually can increase to unrestricted activity when not feeling pain.  If you feel pain: STOP!Marland Kitchen   Let pain protect you from overdoing it.  Use  ice/heat/over-the-counter pain medications to help minimize  his soreness.  Use pain prescriptions as needed to remain active.  It is better to take extra pain medications and be more active than to stay bedridden to avoid all pain medications.     Lifting restrictions    Complete by:  As directed   Avoid heavy lifting initially.  Do not push through pain.  You have no specific weight limit.  Coughing and sneezing or four more stressful to your incision than any lifting you will do. Pain will protect you from injury.  Therefore, avoid intense activity until off all narcotic pain medications.  Coughing and sneezing or four more stressful to your incision than any lifting he will do.     May shower / Bathe    Complete by:  As directed      May walk up steps    Complete by:  As directed      Sexual Activity Restrictions    Complete by:  As directed   Sexual activity as tolerated.  Do not push through pain.  Pain will protect you from injury.     Walk with assistance    Complete by:  As directed   Walk over an hour a day.  May use a walker/cane/companion to help with balance and stamina.            Medication List    TAKE these medications        amoxicillin-clavulanate 875-125 MG per tablet  Commonly known as:  AUGMENTIN  Take 1 tablet by mouth 2 (two) times daily.     amphetamine-dextroamphetamine 20 MG tablet  Commonly known as:  ADDERALL  Take 20 mg by mouth 2 (two) times daily.     aspirin EC 81 MG tablet  Take 162 mg by mouth daily.     atenolol 50 MG tablet  Commonly known as:  TENORMIN  Take 50 mg by mouth daily.     diphenhydrAMINE 25 MG tablet  Commonly known as:  BENADRYL  Take 50 mg by mouth at bedtime as needed for itching or allergies.     DULoxetine 30 MG capsule  Commonly known as:  CYMBALTA  Take 30 mg by mouth daily.     magnesium hydroxide 400 MG/5ML suspension  Commonly known as:  MILK OF MAGNESIA  Take 30 mLs by mouth daily as needed for mild  constipation.     senna 8.6 MG Tabs tablet  Commonly known as:  SENOKOT  Take 2 tablets by mouth daily as needed for mild constipation.     traMADol 50 MG tablet  Commonly known as:  ULTRAM  Take 1-2 tablets (50-100 mg total) by mouth every 6 (six) hours as needed for moderate pain or severe pain.           Follow-up Information    Follow up with [facility] In 2 weeks.   Why:  To follow up after your operation, To follow up after your hospital stay   Contact information:   [place] [id number] [phone]      Follow up with [facility]    .   Why:  please call to arrange appointment   Contact information:   [place] [phone]       Signed: Morton Peters, M.D., F.A.C.S. Gastrointestinal and Minimally Invasive Surgery [facility] [address] [place], [place]  [address] [phone] Main / Paging   01/16/2015, 9:42 PM

## 2015-01-20 ENCOUNTER — Ambulatory Visit: Payer: MEDICAID | Attending: Family Medicine | Admitting: Family Medicine

## 2015-01-20 ENCOUNTER — Encounter: Payer: Self-pay | Admitting: Family Medicine

## 2015-01-20 VITALS — BP 139/98 | HR 111 | Temp 98.1°F | Resp 18 | Ht 70.0 in | Wt 156.8 lb

## 2015-01-20 DIAGNOSIS — F32A Depression, unspecified: Secondary | ICD-10-CM

## 2015-01-20 DIAGNOSIS — F902 Attention-deficit hyperactivity disorder, combined type: Secondary | ICD-10-CM

## 2015-01-20 DIAGNOSIS — K353 Acute appendicitis with localized peritonitis: Secondary | ICD-10-CM

## 2015-01-20 DIAGNOSIS — I10 Essential (primary) hypertension: Secondary | ICD-10-CM

## 2015-01-20 DIAGNOSIS — F329 Major depressive disorder, single episode, unspecified: Secondary | ICD-10-CM | POA: Insufficient documentation

## 2015-01-20 DIAGNOSIS — K3533 Acute appendicitis with perforation and localized peritonitis, with abscess: Secondary | ICD-10-CM

## 2015-01-20 MED ORDER — ATENOLOL 50 MG PO TABS: 50.0000 mg | ORAL_TABLET | Freq: Every day | ORAL | Status: AC

## 2015-01-20 NOTE — Progress Notes (Signed)
Patient here for hospital follow up for laporoscopic appendectomy last week.  Patient states he is feeling "better today."  Patient states incision sites are " not quite as swollen" today.  Patient states incision pain is 2/10, described as swollen/sore.  The center incision is the most painful, according to patient.  Patient requests refill on Atenolol.  He states he ran out and has not taken it today.  Patient's BP 139/98, HR 111.  Patient states he smokes 0.5 pack of cigarettes/day and is ready to quit smoking.

## 2015-01-20 NOTE — Progress Notes (Signed)
Subjective:    Patient ID: Sean Flores, male    DOB: [birth year], 49 y.o.   MRN: [id number]  HPI  Admit Date: 01/13/15 Discharge Date: 01/16/15  Sean Flores is a 49 year old male with a history of Depression, Hypertension, ADHD who recently relocated to [facility] from [place] and presented to the ED with abdominal pain more in the right lower quadrant. Labs revealed leukocytosis of 17,000 with a left shift, CT abdomen revealed acute appendicitis, likely contained perforation of the retrocecal appendicitis, no discrete abscesses or intra-abdominal free air. He was admitted placed on IV Zosyn and underwent laparoscopic appendectomy on 01/13/15. He gradually improved and his diet was advanced as he tolerated it; he was discharged to follow-up with Gen. surgery in 2 weeks.   Interval history: Reports eating well and moving bowels; he does have minimal pain in the periumbilical region but otherwise is doing well. He is requesting refill on his atenolol.  Past Medical History  Diagnosis Date  . Hypertension   . ADHD (attention deficit hyperactivity disorder) 01/15/2015    Past Surgical History  Procedure Laterality Date  . Laminectomy    . Laparoscopic appendectomy N/A 01/13/2015    Procedure: APPENDECTOMY LAPAROSCOPIC;  Surgeon: Darnell Level, MD;  Location: [facility];  Service: General;  Laterality: N/A;    History   Social History  . Marital Status: Married    Spouse Name: N/A  . Number of Children: N/A  . Years of Education: N/A   Occupational History  . Not on file.   Social History Main Topics  . Smoking status: Current Every Day Smoker -- 0.50 packs/day for 20 years  . Smokeless tobacco: Never Used  . Alcohol Use: Yes     Comment: 1 glass wine after dinner approx. 3 to4 times  per week; occasional  . Drug Use: No  . Sexual Activity: Not on file   Other Topics Concern  . Not on file   Social History Narrative    No Known Allergies  Current Outpatient  Prescriptions on File Prior to Visit  Medication Sig Dispense Refill  . amoxicillin-clavulanate (AUGMENTIN) 875-125 MG per tablet Take 1 tablet by mouth 2 (two) times daily. 10 tablet 1  . aspirin EC 81 MG tablet Take 162 mg by mouth daily.    . diphenhydrAMINE (BENADRYL) 25 MG tablet Take 50 mg by mouth at bedtime as needed for itching or allergies.    Marland Kitchen amphetamine-dextroamphetamine (ADDERALL) 20 MG tablet Take 20 mg by mouth 2 (two) times daily.    . DULoxetine (CYMBALTA) 30 MG capsule Take 30 mg by mouth daily.    . magnesium hydroxide (MILK OF MAGNESIA) 400 MG/5ML suspension Take 30 mLs by mouth daily as needed for mild constipation.    . senna (SENOKOT) 8.6 MG TABS tablet Take 2 tablets by mouth daily as needed for mild constipation.    . traMADol (ULTRAM) 50 MG tablet Take 1-2 tablets (50-100 mg total) by mouth every 6 (six) hours as needed for moderate pain or severe pain. (Patient not taking: Reported on 01/20/2015) 30 tablet 0   No current facility-administered medications on file prior to visit.     Review of Systems  Constitutional: Negative for activity change and appetite change.  HENT: Negative for sinus pressure and sore throat.   Eyes: Negative for visual disturbance.  Respiratory: Negative for chest tightness and shortness of breath.   Cardiovascular: Negative for chest pain and palpitations.  Gastrointestinal: Positive for abdominal pain. Negative  for abdominal distention.  Endocrine: Negative for cold intolerance, heat intolerance and polyphagia.  Genitourinary: Negative for dysuria, frequency and difficulty urinating.  Musculoskeletal: Negative for back pain, joint swelling and arthralgias.  Skin: Negative for color change.  Neurological: Negative for dizziness, tremors and weakness.  Psychiatric/Behavioral: Negative for suicidal ideas and behavioral problems.         Objective: Filed Vitals:   01/20/15 1153  BP: 139/98  Pulse: 111  Temp: 98.1 F (36.7 C)    TempSrc: Oral  Resp: 18  Height: 5\' 10"  (1.778 m)  Weight: 156 lb 12.8 oz (71.124 kg)  SpO2: 98%      Physical Exam  Constitutional: He is oriented to person, place, and time. He appears well-developed and well-nourished.  HENT:  Head: Normocephalic and atraumatic.  Right Ear: External ear normal.  Left Ear: External ear normal.  Eyes: Conjunctivae and EOM are normal. Pupils are equal, round, and reactive to light.  Neck: Normal range of motion. Neck supple. No tracheal deviation present.  Cardiovascular: Regular rhythm and normal heart sounds.  Tachycardia present.   No murmur heard. Pulmonary/Chest: Effort normal and breath sounds normal. No respiratory distress. He has no wheezes. He exhibits no tenderness.  Abdominal: Soft. Bowel sounds are normal. He exhibits no mass. There is no tenderness.  steri strips in place around sites of surgical wound, mildly tender centrally, no evidence of infection.  Musculoskeletal: Normal range of motion. He exhibits no edema or tenderness.  Neurological: He is alert and oriented to person, place, and time.  Skin: Skin is warm and dry.  Psychiatric: He has a normal mood and affect.     CBC Latest Ref Rng 01/15/2015 01/13/2015  WBC 4.0 - 10.5 K/uL 11.0(H) 17.1(H)  Hemoglobin 13.0 - 17.0 g/dL 11.3(L) 15.1  Hematocrit 39.0 - 52.0 % 34.7(L) 43.9  Platelets 150 - 400 K/uL 149(L) 194    EXAM: CT ABDOMEN AND PELVIS WITH CONTRAST  TECHNIQUE: Multidetector CT imaging of the abdomen and pelvis was performed using the standard protocol following bolus administration of intravenous contrast.  CONTRAST: OMNIPAQUE IOHEXOL 300 MG/ML SOLN, 83mL OMNIPAQUE IOHEXOL 300 MG/ML SOLN  COMPARISON: None.  FINDINGS: Musculoskeletal: L5-S1 PLIF with solid fusion. Grade I retrolisthesis of L4 on L5. Incidental synovial herniation cyst in the anterior LEFT femoral neck.  Lung Bases: Dependent atelectasis.  Liver: Normal.  Spleen:  Normal.  Gallbladder: Normal.  Common bile duct: Normal.  Pancreas: Normal.  Adrenal glands: Normal bilaterally.  Kidneys: Normal renal enhancement and delayed excretion of contrast. Both ureters appear normal.  Stomach: Normal.  Small bowel: No small bowel obstruction. Small bowel contrast has reached the jejunum. There is no contrast in the terminal ileum which is decompressed. Inflammatory changes are present along the terminal ileum associated with acute appendicitis.  Colon: Acute appendicitis present. Feculent material is present along the course of the mid appendix (image 63 series 2) with dilation of the appendix. The appendix measures up to 14 mm transversely. There is probably contained perforation given the size of the appendix. This is immediately adjacent to the RIGHT external iliac vessels. Distal colon appears normal.  Pelvic Genitourinary: Normal urinary bladder.  Peritoneum: Tiny amount of fluid and phlegmon is present in the anatomic pelvis. This is secondary to acute appendicitis.  Vascular/lymphatic: No acute vascular abnormality. Reactive small ileo colic lymph nodes.  Body Wall: Normal.  IMPRESSION: Acute appendicitis. Likely contained perforation of the retrocecal appendix. Significantly, no discrete abscess or intra-abdominal free air.  Electronically Signed  By: Andreas Newport M.D.  On: 01/13/2015 14:04             Assessment & Plan:  49 year old male with a history of depression, hypertension recently hospitalized for perforated appendicitis status post laparoscopic appendectomy and is doing well.  Acute and ascites status post appendectomy: Wounds are doing well and he is moving his bowels. CBC ordered to monitor leukocytosis Takes naproxen for pain.  Hypertension: Controlled. Refilled atenolol.  Depression: Remains on Cymbalta.  ADHD: He will need to establish with a PCP and discuss his  Adderall which he was receiving while he was in [place].  This note has been created with Education officer, environmental. Any transcriptional errors are unintentional.

## 2015-01-20 NOTE — Patient Instructions (Signed)
Appendicitis °Appendicitis is when the appendix is swollen (inflamed). The inflammation can lead to developing a hole (perforation) and a collection of pus (abscess). °CAUSES  °There is not always an obvious cause of appendicitis. Sometimes it is caused by an obstruction in the appendix. The obstruction can be caused by: °· A small, hard, pea-sized ball of stool (fecalith). °· Enlarged lymph glands in the appendix. °SYMPTOMS  °· Pain around your belly button (navel) that moves toward your lower right belly (abdomen). The pain can become more severe and sharp as time passes. °· Tenderness in the lower right abdomen. Pain gets worse if you cough or make a sudden movement. °· Feeling sick to your stomach (nauseous). °· Throwing up (vomiting). °· Loss of appetite. °· Fever. °· Constipation. °· Diarrhea. °· Generally not feeling well. °DIAGNOSIS  °· Physical exam. °· Blood tests. °· Urine test. °· X-rays or a CT scan may confirm the diagnosis. °TREATMENT  °Once the diagnosis of appendicitis is made, the most common treatment is to remove the appendix as soon as possible. This procedure is called appendectomy. In an open appendectomy, a cut (incision) is made in the lower right abdomen and the appendix is removed. In a laparoscopic appendectomy, usually 3 small incisions are made. Long, thin instruments and a camera tube are used to remove the appendix. Most patients go home in 24 to 48 hours after appendectomy. °In some situations, the appendix may have already perforated and an abscess may have formed. The abscess may have a "wall" around it as seen on a CT scan. In this case, a drain may be placed into the abscess to remove fluid, and you may be treated with antibiotic medicines that kill germs. The medicine is given through a tube in your vein (IV). Once the abscess has resolved, it may or may not be necessary to have an appendectomy. You may need to stay in the hospital longer than 48 hours. °Document Released:  07/23/2005 Document Revised: 01/22/2012 Document Reviewed: 10/18/2009 °ExitCare® Patient Information ©2015 [employer]. This information is not intended to replace advice given to you by your health care provider. Make sure you discuss any questions you have with your health care provider. ° °

## 2015-01-21 ENCOUNTER — Telehealth: Payer: Self-pay | Admitting: General Practice

## 2015-01-21 ENCOUNTER — Telehealth: Payer: Self-pay

## 2015-01-21 LAB — CBC WITH DIFFERENTIAL/PLATELET
BASOS PCT: 1 % (ref 0–1)
Basophils Absolute: 0.1 10*3/uL (ref 0.0–0.1)
Eosinophils Absolute: 0.6 10*3/uL (ref 0.0–0.7)
Eosinophils Relative: 7 % — ABNORMAL HIGH (ref 0–5)
HEMATOCRIT: 42.7 % (ref 39.0–52.0)
Hemoglobin: 13.8 g/dL (ref 13.0–17.0)
Lymphocytes Relative: 23 % (ref 12–46)
Lymphs Abs: 2.1 10*3/uL (ref 0.7–4.0)
MCH: 33.1 pg (ref 26.0–34.0)
MCHC: 32.3 g/dL (ref 30.0–36.0)
MCV: 102.4 fL — ABNORMAL HIGH (ref 78.0–100.0)
MONO ABS: 0.6 10*3/uL (ref 0.1–1.0)
MONOS PCT: 7 % (ref 3–12)
MPV: 11 fL (ref 8.6–12.4)
Neutro Abs: 5.6 10*3/uL (ref 1.7–7.7)
Neutrophils Relative %: 62 % (ref 43–77)
Platelets: 337 10*3/uL (ref 150–400)
RBC: 4.17 MIL/uL — ABNORMAL LOW (ref 4.22–5.81)
RDW: 12.9 % (ref 11.5–15.5)
WBC: 9.1 10*3/uL (ref 4.0–10.5)

## 2015-01-21 NOTE — Telephone Encounter (Signed)
Nurse called patient, patient verified date of birth. Patient aware of stable labs.

## 2015-01-21 NOTE — Telephone Encounter (Signed)
-----   Message from Jaclyn Shaggy, MD sent at 01/21/2015  8:35 AM EDT ----- Labs are stable.

## 2015-01-21 NOTE — Telephone Encounter (Signed)
-----   Message from Enobong Amao, MD sent at 01/21/2015  8:35 AM EDT ----- Labs are stable. 

## 2015-01-21 NOTE — Telephone Encounter (Signed)
Patient has called in today to return the call to nurse about results; patient said he can be reached at mobile number;

## 2015-01-21 NOTE — Telephone Encounter (Signed)
Nurse called patient, reached voicemail. Left message for patient to call Jasslyn Finkel at [phone].   

## 2015-02-03 ENCOUNTER — Ambulatory Visit: Payer: Self-pay | Admitting: Family Medicine

## 2016-03-06 DEATH — deceased
